# Patient Record
Sex: Female | Born: 2000 | Hispanic: Yes | Marital: Married | Smoking: Former smoker
Health system: Southern US, Community
[De-identification: ages and names within clinical notes are randomized; demographics above are authoritative.]

## PROBLEM LIST (undated history)

## (undated) ENCOUNTER — Emergency Department: Discharge status: Absent without leave | Payer: Self-pay

## (undated) DIAGNOSIS — R7309 Other abnormal glucose: Secondary | ICD-10-CM

## (undated) DIAGNOSIS — E119 Type 2 diabetes mellitus without complications: Secondary | ICD-10-CM

## (undated) HISTORY — PX: NO PAST SURGERIES: SHX2092

## (undated) HISTORY — DX: Other abnormal glucose: R73.09

## (undated) HISTORY — DX: Type 2 diabetes mellitus without complications: E11.9

## (undated) HISTORY — PX: OTHER SURGICAL HISTORY: SHX169

---
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
## 2022-06-23 ENCOUNTER — Ambulatory Visit (LOCAL_COMMUNITY_HEALTH_CENTER): Payer: Medicaid Other | Admitting: Nurse Practitioner

## 2022-06-23 DIAGNOSIS — Z3009 Encounter for other general counseling and advice on contraception: Secondary | ICD-10-CM

## 2022-06-23 DIAGNOSIS — Z3200 Encounter for pregnancy test, result unknown: Secondary | ICD-10-CM

## 2022-06-23 DIAGNOSIS — Z3201 Encounter for pregnancy test, result positive: Secondary | ICD-10-CM

## 2022-06-23 DIAGNOSIS — Z3040 Encounter for surveillance of contraceptives, unspecified: Secondary | ICD-10-CM

## 2022-06-23 LAB — PREGNANCY, URINE: Preg Test, Ur: POSITIVE — AB

## 2022-06-23 MED ORDER — PRENATAL VITAMINS 28-0.8 MG PO TABS: 1.0000 | ORAL_TABLET | Freq: Every day | ORAL | 0 refills | Status: DC

## 2022-06-23 NOTE — Progress Notes (Signed)
Pt here for physical but feels she is pregnant.  Sent to lab for PT.  PT is positive.  Two language line interpreters were utilized for this patient. Initial contact Molly Maduro, [phone] and Lafonda Mosses [phone].  Positive PT packet provided to patient in addition to prenatal vitamins.  Discussed WIC and Social Services to apply for Medicaid for Pregnant Teresa Balint, RN

## 2022-07-28 ENCOUNTER — Telehealth: Payer: Self-pay

## 2022-07-28 NOTE — Telephone Encounter (Signed)
Call to patient to verify some information for upcoming New OB appointment.   Patient is unsure of LMP. Informed patient to give it some though and give [place] an approx LMP for appointment tomorrow. Patient states she thinks she is 13 weeks because she was told in her last visit with [facility] she was 11 weeks.   Per medical history patient has hx of diabetes because her physician in [place] informed her she has high blood sugar. Patient states she was never diagnosed with diabetes, never took diabetic medications, not did she have to check blood sugar. She states it was elevated x1 only.   Informed patient there was [facility] record with her same name and birthday but address in [place]. She states that is not her as she never lived in [place]. Created new [facility] for patient.   Earlyne Iba, RN

## 2022-07-29 ENCOUNTER — Ambulatory Visit: Payer: Medicaid Other | Admitting: Nurse Practitioner

## 2022-07-29 ENCOUNTER — Encounter: Payer: Self-pay | Admitting: Nurse Practitioner

## 2022-07-29 VITALS — BP 96/62 | HR 67 | Temp 98.1°F | Wt 173.6 lb

## 2022-07-29 DIAGNOSIS — Z8719 Personal history of other diseases of the digestive system: Secondary | ICD-10-CM

## 2022-07-29 DIAGNOSIS — Z23 Encounter for immunization: Secondary | ICD-10-CM | POA: Diagnosis not present

## 2022-07-29 DIAGNOSIS — Z3482 Encounter for supervision of other normal pregnancy, second trimester: Secondary | ICD-10-CM

## 2022-07-29 DIAGNOSIS — Z3480 Encounter for supervision of other normal pregnancy, unspecified trimester: Secondary | ICD-10-CM | POA: Insufficient documentation

## 2022-07-29 DIAGNOSIS — Z87898 Personal history of other specified conditions: Secondary | ICD-10-CM | POA: Insufficient documentation

## 2022-07-29 DIAGNOSIS — O0932 Supervision of pregnancy with insufficient antenatal care, second trimester: Secondary | ICD-10-CM

## 2022-07-29 DIAGNOSIS — O219 Vomiting of pregnancy, unspecified: Secondary | ICD-10-CM

## 2022-07-29 DIAGNOSIS — O039 Complete or unspecified spontaneous abortion without complication: Secondary | ICD-10-CM | POA: Insufficient documentation

## 2022-07-29 DIAGNOSIS — O0930 Supervision of pregnancy with insufficient antenatal care, unspecified trimester: Secondary | ICD-10-CM

## 2022-07-29 HISTORY — DX: Personal history of other diseases of the digestive system: Z87.19

## 2022-07-29 LAB — WET PREP FOR TRICH, YEAST, CLUE
Trichomonas Exam: NEGATIVE
Yeast Exam: NEGATIVE

## 2022-07-29 LAB — URINALYSIS
Bilirubin, UA: NEGATIVE
Glucose, UA: NEGATIVE
Ketones, UA: NEGATIVE
Nitrite, UA: NEGATIVE
Protein,UA: NEGATIVE
RBC, UA: NEGATIVE
Specific Gravity, UA: 1.02 (ref 1.005–1.030)
Urobilinogen, Ur: 0.2 mg/dL (ref 0.2–1.0)
pH, UA: 6.5 (ref 5.0–7.5)

## 2022-07-29 LAB — HEMOGLOBIN, FINGERSTICK: Hemoglobin: 12.3 g/dL (ref 11.1–15.9)

## 2022-07-29 MED ORDER — PROMETHAZINE HCL 25 MG PO TABS: 25.0000 mg | ORAL_TABLET | Freq: Three times a day (TID) | ORAL | 0 refills | Status: DC | PRN

## 2022-07-29 NOTE — Progress Notes (Unsigned)
Presents for initiation of prenatal care and denies any medical care thus far in pregnancy. Born in the [place] and to [place] at age 21 years. Returned to [place] at ~ age 77 years. Returned to [place] for visit 2 years ago. Quantiferon TB Gold test today. Desires MaternitT-21 and AFP only testing today. Counseled to set alarm on phone as reminder to take PNV daily and assist her in not forgetting to take (missing ~ 2 days weekly). As client born in the [place], encouraged to apply for pregnancy Medicaid. Per consult with Glenna Fellows FNP-BC, administer 1 hour glucola test today due to past history of elevated blood sugar per client verbal report. Jossie Ng, RN Wet prep reviewed - negative results. Hgb = 12.3 and no intervention required per standing order. Urine dip reviewed by Glenna Fellows FNP-BC. Dip with trace leukocytes, otherwise WNL. Jossie Ng, RN

## 2022-07-29 NOTE — Progress Notes (Unsigned)
[facility]  Maternal Health Clinic   INITIAL PRENATAL VISIT NOTE  Subjective:  Teresa [name] is a 21 y.o. G2P0010 at [redacted]w[redacted]d being seen today to start prenatal care at the [facility].  She is currently monitored for the following issues for this low-risk pregnancy and has Supervision of other normal pregnancy, antepartum; History of non-nicotine vaping; SAB (spontaneous abortion)  12/20/21; and History of constipation on their problem list.  Patient reports headache, nausea, and vomiting.  Contractions: Not present. Vag. Bleeding: None.  Movement: Absent. Denies leaking of fluid.   Indications for ASA therapy (per uptodate) One of the following: Previous pregnancy with preeclampsia, especially early onset and with an adverse outcome No Multifetal gestation No Chronic hypertension No Type 1 or 2 diabetes mellitus No Chronic kidney disease No Autoimmune disease (antiphospholipid syndrome, systemic lupus erythematosus) No  Two or more of the following: Nulliparity No Obesity (body mass index >30 kg/m2) No Family history of preeclampsia in mother or sister No Age ?35 years No Sociodemographic characteristics (African American race, low socioeconomic level) Yes Personal risk factors (eg, previous pregnancy with low birth weight or small for gestational age infant, previous adverse pregnancy outcome [eg, stillbirth], interval >10 years between pregnancies) No   The following portions of the patient's history were reviewed and updated as appropriate: allergies, current medications, past family history, past medical history, past social history, past surgical history and problem list. Problem list updated.  Objective:   Vitals:   07/29/22 1336  BP: 96/62  Pulse: 67  Temp: 98.1 F (36.7 C)  Weight: 173 lb 9.6 oz (78.7 kg)    Fetal Status: Fetal Heart Rate (bpm): 160 Fundal Height: 16 cm Movement: Absent  Presentation:  Undeterminable   Physical Exam Vitals and nursing note reviewed.  Constitutional:      General: She is not in acute distress.    Appearance: Normal appearance. She is well-developed.  HENT:     Head: Normocephalic and atraumatic.     Right Ear: External ear normal.     Left Ear: External ear normal.     Nose: Nose normal. No congestion or rhinorrhea.     Mouth/Throat:     Lips: Pink.     Mouth: Mucous membranes are moist.     Dentition: Normal dentition. No dental caries.     Pharynx: Oropharynx is clear. Uvula midline.     Comments: Dentition: no visible signs of dental caries  Eyes:     General: No scleral icterus.    Conjunctiva/sclera: Conjunctivae normal.     Pupils: Pupils are equal, round, and reactive to light.  Neck:     Thyroid: No thyroid mass or thyromegaly.  Cardiovascular:     Rate and Rhythm: Normal rate.     Pulses: Normal pulses.     Comments: Extremities are warm and well perfused Pulmonary:     Effort: Pulmonary effort is normal.     Breath sounds: Normal breath sounds.  Chest:     Chest wall: No mass.  Breasts:    Tanner Score is 5.     Breasts are symmetrical.     Right: Normal. No mass, nipple discharge or skin change.     Left: Normal. No mass, nipple discharge or skin change.  Abdominal:     General: Abdomen is flat.     Palpations: Abdomen is soft.     Tenderness: There is no abdominal tenderness.     Comments: Gravid  Genitourinary:    General: Normal vulva.     Exam position: Lithotomy position.     Pubic Area: No rash.      Labia:        Right: No rash.        Left: No rash.      Vagina: Normal. No vaginal discharge.     Cervix: No cervical motion tenderness or friability.     Uterus: Normal. Enlarged (Gravid 15 size). Not tender.      Adnexa: Right adnexa normal and left adnexa normal.     Rectum: Normal. No external hemorrhoid.     Comments: Amount Discharge: small  Odor: No pH: less than 4.5 Adheres to vaginal wall: No Color:  Thomos Domine  Musculoskeletal:     Cervical back: Full passive range of motion without pain, normal range of motion and neck supple.     Right lower leg: No edema.     Left lower leg: No edema.  Lymphadenopathy:     Cervical: No cervical adenopathy.     Upper Body:     Right upper body: No axillary adenopathy.     Left upper body: No axillary adenopathy.  Skin:    General: Skin is warm and dry.     Capillary Refill: Capillary refill takes less than 2 seconds.  Neurological:     Mental Status: She is alert and oriented to person, place, and time.  Psychiatric:        Attention and Perception: Attention normal.        Mood and Affect: Mood normal.        Speech: Speech normal.        Behavior: Behavior normal. Behavior is cooperative.     Assessment and Plan:  Pregnancy: G2P0010 at [redacted]w[redacted]d  1. History of constipation -History of constipation, patient given resource sheet on how to manage or prevent constipation.  Will continue to monitor.   2. SAB (spontaneous abortion)  12/20/21 -SAB in 12/2021.   3. History of non-nicotine vaping -Denies current use.   4. Supervision of other normal pregnancy, antepartum -21 year old female in clinic today for prenatal care. -Patient states she is taking PNV daily. -Patient reports some nausea and vomiting.  Patient encouraged to eat meals high in protein every 2 hours for nausea and also given resource sheet. Will continue to monitor.   -Patient also reports left sided lower abdominal pain.  Pain may be attributed to round ligament pain or musculoskeletal pain due to vomiting.  Resource sheet given to patient on round ligament pain.  Urine culture obtained to assess for other infections.  -Patient excited about the pregnancy. -Lives with husband.  Patient works at [employer]. -Patient states that she is unsure of last LMP, dating U/S submitted.  -Denies alcohol and drug use.  Last alcohol use was around 5 months ago.  UDS performed today.  -Patient  reports never being to a dentist.   Patient given dental information.   -Declines genetic counseling.  Agrees to [employer] -Patient states she has had 1 COVID vaccine.  Patient agrees to Flu vaccine today.  -Patient is currently 15 weeks, ASA not recommended due to late entry to care.  -PHQ-9= 9, denies thoughts of self harm or signs and symptoms of depression.  Patient states score is a 9 due to being extremely sick and tired. -Hgb= 12.3 -Patient reports a history of elevated blood glucose in the past.  BMI 29, will obtain a 1 hour glucose to  rule out diabetes.  Will await test results.       - Prenatal Profile I - QuantiFERON-TB Gold Plus - Glucose, 1 hour - Hgb Fractionation Cascade - AFP, Serum, Open Spina Bifida - Varicella zoster antibody, IgG - MaterniT 21 plus Core, Blood - WET PREP FOR TRICH, YEAST, CLUE - [id number] Drug Screen - Hemoglobin, venipuncture - Urinalysis (Urine Dip)  5. Late prenatal care -Prenatal care initiated at 15 weeks.      6. Vomiting affecting pregnancy -Frequent nausea and vomiting reported by patient.  Patient encouraged to eat meals high in protein every 2 hours for nausea and also given resource sheet. Will continue to monitor.   Patient also given Phenergan if needed, advised to take 0.5 - 1 tab PRN.   - promethazine (PHENERGAN) 25 MG tablet; Take 1 tablet (25 mg total) by mouth every 8 (eight) hours as needed for nausea or vomiting (take 0.5 - 1 tablet q8hrs PRN).  Dispense: 15 tablet; Refill: 0     Discussed overview of care and coordination with inpatient delivery practices including [facility], [facility], [facility] and [facility].    Term labor symptoms and general obstetric precautions including but not limited to vaginal bleeding, contractions, leaking of fluid and fetal movement were reviewed in detail with the patient.  Please refer to After Visit Summary for other counseling recommendations.   Due to a language barrier an  interpreter Roddie Mc Yemen) was used for the provider portion of the visit.     Return in 4 weeks (on 08/26/2022) for Routine prenatal care visit.    Glenna Fellows, FNP

## 2022-07-30 ENCOUNTER — Telehealth: Payer: Self-pay

## 2022-07-30 DIAGNOSIS — O0930 Supervision of pregnancy with insufficient antenatal care, unspecified trimester: Secondary | ICD-10-CM | POA: Insufficient documentation

## 2022-07-30 DIAGNOSIS — O219 Vomiting of pregnancy, unspecified: Secondary | ICD-10-CM | POA: Insufficient documentation

## 2022-07-30 LAB — 789231 7+OXYCODONE-BUND
Amphetamines, Urine: NEGATIVE ng/mL
BENZODIAZ UR QL: NEGATIVE ng/mL
Barbiturate screen, urine: NEGATIVE ng/mL
Cannabinoid Quant, Ur: NEGATIVE ng/mL
Cocaine (Metab.): NEGATIVE ng/mL
OPIATE SCREEN URINE: NEGATIVE ng/mL
Oxycodone/Oxymorphone, Urine: NEGATIVE ng/mL
PCP Quant, Ur: NEGATIVE ng/mL

## 2022-07-30 NOTE — Telephone Encounter (Signed)
Call to client with [facility] [employer] ID # [phone] to notify her of 08/05/2022 1600 [place] appt at [facility] (arrive 1545 with full bladder). Per recorded message, voicemail box is not set up. As appt 08/05/2022 and [facility] closed 12/25 - 08/05/22, call made to spouse (emergency contact). Spouse states client is working and unable to answer her phone. Spouse provided [place] appt information and facility address. He states will give his wife the [place] appt information. Jossie Ng, RN

## 2022-08-05 ENCOUNTER — Other Ambulatory Visit: Payer: Self-pay | Admitting: Nurse Practitioner

## 2022-08-05 ENCOUNTER — Ambulatory Visit
Admission: RE | Admit: 2022-08-05 | Discharge: 2022-08-05 | Disposition: A | Discharge status: Home / Self Care | Payer: Medicaid Other | Source: Ambulatory Visit | Attending: Nurse Practitioner | Admitting: Nurse Practitioner

## 2022-08-05 DIAGNOSIS — O219 Vomiting of pregnancy, unspecified: Secondary | ICD-10-CM

## 2022-08-05 DIAGNOSIS — Z3A12 12 weeks gestation of pregnancy: Secondary | ICD-10-CM | POA: Insufficient documentation

## 2022-08-05 DIAGNOSIS — O039 Complete or unspecified spontaneous abortion without complication: Secondary | ICD-10-CM

## 2022-08-05 DIAGNOSIS — Z3481 Encounter for supervision of other normal pregnancy, first trimester: Secondary | ICD-10-CM | POA: Diagnosis not present

## 2022-08-05 DIAGNOSIS — Z3480 Encounter for supervision of other normal pregnancy, unspecified trimester: Secondary | ICD-10-CM | POA: Diagnosis present

## 2022-08-05 DIAGNOSIS — O0930 Supervision of pregnancy with insufficient antenatal care, unspecified trimester: Secondary | ICD-10-CM

## 2022-08-05 DIAGNOSIS — Z8719 Personal history of other diseases of the digestive system: Secondary | ICD-10-CM

## 2022-08-05 DIAGNOSIS — Z87898 Personal history of other specified conditions: Secondary | ICD-10-CM

## 2022-08-10 NOTE — L&D Delivery Note (Addendum)
     Delivery Note   Teresa [name] is a 22 y.o. G2P0010 at [redacted]w[redacted]d Estimated Date of Delivery: 02/13/23  PRE-OPERATIVE DIAGNOSIS:  1) [redacted]w[redacted]d pregnancy.    POST-OPERATIVE DIAGNOSIS:  1) [redacted]w[redacted]d pregnancy s/p Vaginal, Spontaneous    Delivery Type: Vaginal, Spontaneous    Delivery Anesthesia: Local   Labor Complications:  none    ESTIMATED BLOOD LOSS: 460 ml    FINDINGS:   1) female infant, Apgar scores of  8  at 1 minute and   9 at 5 minutes and a birthweight pending, infant skin to skin .    2) Nuchal cord: no  SPECIMENS:   PLACENTA:   Appearance: Intact , 3 vessel cord    Removal: Spontaneous      Disposition:  per protocol   DISPOSITION:  Infant to left in stable condition in the delivery room, with L&D personnel and mother,  NARRATIVE SUMMARY: Labor course:  Ms. Teresa [name] is a G2P0010 at [redacted]w[redacted]d who presented for labor management.  She progressed well in labor without pitocin.  She received the appropriate anesthesia and proceeded to complete dilation. She evidenced good maternal expulsive effort during the second stage. She went on to deliver a viable female infant. The placenta delivered without problems and was noted to be complete. A perineal and vaginal examination was performed. Lacerations: 2nd degree , perineal   Lacerations was repaired with 3-0 Vicryl Rapide suture using local anesthesia. The patient tolerated this well.  Doreene Burke, CNM  02/15/2023 8:19 AM

## 2022-08-11 ENCOUNTER — Telehealth: Payer: Self-pay

## 2022-08-11 LAB — PREGNANCY, INITIAL SCREEN
Antibody Screen: NEGATIVE
Basophils Absolute: 0.1 10*3/uL (ref 0.0–0.2)
Basos: 1 %
Bilirubin, UA: NEGATIVE
Chlamydia trachomatis, NAA: NEGATIVE
EOS (ABSOLUTE): 0.2 10*3/uL (ref 0.0–0.4)
Eos: 2 %
Glucose, UA: NEGATIVE
HCV Ab: NONREACTIVE
HIV Screen 4th Generation wRfx: NONREACTIVE
Hematocrit: 37.9 % (ref 34.0–46.6)
Hemoglobin: 12.5 g/dL (ref 11.1–15.9)
Hepatitis B Surface Ag: NEGATIVE
Immature Grans (Abs): 0 10*3/uL (ref 0.0–0.1)
Immature Granulocytes: 0 %
Ketones, UA: NEGATIVE
Lymphocytes Absolute: 2.2 10*3/uL (ref 0.7–3.1)
Lymphs: 23 %
MCH: 28.9 pg (ref 26.6–33.0)
MCHC: 33 g/dL (ref 31.5–35.7)
MCV: 88 fL (ref 79–97)
Monocytes Absolute: 0.6 10*3/uL (ref 0.1–0.9)
Monocytes: 6 %
Neisseria Gonorrhoeae by PCR: NEGATIVE
Neutrophils Absolute: 6.6 10*3/uL (ref 1.4–7.0)
Neutrophils: 68 %
Nitrite, UA: NEGATIVE
Platelets: 269 10*3/uL (ref 150–450)
Protein,UA: NEGATIVE
RBC, UA: NEGATIVE
RBC: 4.33 x10E6/uL (ref 3.77–5.28)
RDW: 13.1 % (ref 11.7–15.4)
RPR Ser Ql: NONREACTIVE
Rh Factor: POSITIVE
Rubella Antibodies, IGG: 1.04 index (ref >0.99)
Specific Gravity, UA: 1.016 (ref 1.005–1.030)
Urobilinogen, Ur: 0.2 mg/dL (ref 0.2–1.0)
WBC: 9.6 10*3/uL (ref 3.4–10.8)
pH, UA: 6.5 (ref 5.0–7.5)

## 2022-08-11 LAB — GLUCOSE, 1 HOUR GESTATIONAL: Gestational Diabetes Screen: 77 mg/dL (ref 70–139)

## 2022-08-11 LAB — AFP, SERUM, OPEN SPINA BIFIDA
AFP MoM: 0.38
AFP Value: 11 ng/mL
Gest. Age on Collection Date: 15.6 weeks
Maternal Age At EDD: 20 yr
OSBR Risk 1 IN: 10000
Test Results:: NEGATIVE
Weight: 173 [lb_av]

## 2022-08-11 LAB — QUANTIFERON-TB GOLD PLUS
QuantiFERON Mitogen Value: >10 IU/mL
QuantiFERON Nil Value: 0.09 IU/mL
QuantiFERON TB1 Ag Value: 0.12 IU/mL
QuantiFERON TB2 Ag Value: 0.14 IU/mL
QuantiFERON-TB Gold Plus: NEGATIVE

## 2022-08-11 LAB — MATERNIT 21 PLUS CORE, BLOOD
Fetal Fraction: 11
Result (T21): NEGATIVE
Trisomy 13 (Patau syndrome): NEGATIVE
Trisomy 18 (Edwards syndrome): NEGATIVE
Trisomy 21 (Down syndrome): NEGATIVE

## 2022-08-11 LAB — HGB FRACTIONATION CASCADE
Hgb A2: 2.7 % (ref 1.8–3.2)
Hgb A: 97.3 % (ref 96.4–98.8)
Hgb F: 0 % (ref 0.0–2.0)
Hgb S: 0 %

## 2022-08-11 LAB — MICROSCOPIC EXAMINATION
Casts: NONE SEEN /lpf
RBC, Urine: NONE SEEN /hpf (ref 0–2)
WBC, UA: NONE SEEN /hpf (ref 0–5)

## 2022-08-11 LAB — HCV INTERPRETATION

## 2022-08-11 LAB — VARICELLA ZOSTER ANTIBODY, IGG: Varicella zoster IgG: 586 index (ref >165)

## 2022-08-11 LAB — URINE CULTURE, OB REFLEX

## 2022-08-11 NOTE — Telephone Encounter (Signed)
TC to [facility] to add patient's weight to AFP only test. .Jenetta Downer, RN

## 2022-08-21 NOTE — Addendum Note (Signed)
Addended by: Cletis Media on: 08/21/2022 04:07 PM   Modules accepted: Orders

## 2022-08-26 ENCOUNTER — Ambulatory Visit: Payer: Medicaid Other | Admitting: Advanced Practice Midwife

## 2022-08-26 VITALS — BP 93/66 | HR 61 | Temp 97.0°F | Wt 172.2 lb

## 2022-08-26 DIAGNOSIS — O0930 Supervision of pregnancy with insufficient antenatal care, unspecified trimester: Secondary | ICD-10-CM

## 2022-08-26 DIAGNOSIS — O0932 Supervision of pregnancy with insufficient antenatal care, second trimester: Secondary | ICD-10-CM

## 2022-08-26 DIAGNOSIS — Z87898 Personal history of other specified conditions: Secondary | ICD-10-CM

## 2022-08-26 DIAGNOSIS — Z3480 Encounter for supervision of other normal pregnancy, unspecified trimester: Secondary | ICD-10-CM

## 2022-08-26 DIAGNOSIS — Z3482 Encounter for supervision of other normal pregnancy, second trimester: Secondary | ICD-10-CM

## 2022-08-26 NOTE — Progress Notes (Signed)
[place] [facility] Maternal Health Clinic  PRENATAL VISIT NOTE  Subjective:  Teresa [name] is a 22 y.o. G2P0010 at [redacted]w[redacted]d being seen today for ongoing prenatal care.  She is currently monitored for the following issues for this low-risk pregnancy and has Supervision of other normal pregnancy, antepartum; History of non-nicotine vaping; SAB (spontaneous abortion)  12/20/21; History of constipation; Late prenatal care; and Vomiting affecting pregnancy on their problem list.  Patient reports headache.  Contractions: Not present. Vag. Bleeding: None.  Movement: Present. Denies leaking of fluid/ROM.   The following portions of the patient's history were reviewed and updated as appropriate: allergies, current medications, past family history, past medical history, past social history, past surgical history and problem list. Problem list updated.  Objective:   Vitals:   08/26/22 1509  BP: 93/66  Pulse: 61  Temp: (!) 97 F (36.1 C)  Weight: 172 lb 3.2 oz (78.1 kg)    Fetal Status: Fetal Heart Rate (bpm): 150 Fundal Height: 19 cm Movement: Present     General:  Alert, oriented and cooperative. Patient is in no acute distress.  Skin: Skin is warm and dry. No rash noted.   Cardiovascular: Normal heart rate noted  Respiratory: Normal respiratory effort, no problems with respiration noted  Abdomen: Soft, gravid, appropriate for gestational age.  Pain/Pressure: Absent     Pelvic: Cervical exam deferred        Extremities: Normal range of motion.  Edema: None  Mental Status: Normal mood and affect. Normal behavior. Normal judgment and thought content.   Assessment and Plan:  Pregnancy: G2P0010 at [redacted]w[redacted]d  1. Supervision of other normal pregnancy, antepartum Here with FOB C/o h/a past 3 nights always bilateral temples x 30 min relieved with cold cloth and sleep; suggestions given Last vomited 5 days ago. 1 hour glucola=77 on 07/29/22 NIPS neg 07/29/22 AFP only neg  07/29/22 Reviewed 08/05/22 u/s at 12  4/7 with EDC=02/13/23 Not working Living with husband Doesn't know how to set up voicemail--RN assisted pt Anatomy u/s ordered  - US OB Comp + 14 Wk; Future  2. Late prenatal care    Preterm labor symptoms and general obstetric precautions including but not limited to vaginal bleeding, contractions, leaking of fluid and fetal movement were reviewed in detail with the patient. Please refer to After Visit Summary for other counseling recommendations.  Return in about 4 weeks (around 09/23/2022) for routine PNC.  Future Appointments  Date Time Provider [place]  09/23/2022 10:00 AM [facility] PROVIDER [facility] None    Herbie Saxon, CNM

## 2022-08-26 NOTE — Progress Notes (Signed)
Gilbert France utilized.  Requested patient set up voice mail. Undecided Pediatrician (has list) and undecided birth control method after pregnancy. BThiele RN

## 2022-08-27 ENCOUNTER — Telehealth: Payer: Self-pay

## 2022-08-27 NOTE — Telephone Encounter (Signed)
TC to patient with [facility], [place], [place] [address], to inform client of [facility] U/S on 09/18/22 at 1:00. Patient counseled to arrive 12:00 at the [facility] with full bladder. Patient can take 1 adult, no children to appointment. Patient states understanding. Reminded of next [place] [facility] as well.Jenetta Downer, RN

## 2022-09-10 IMAGING — US US OB < 14 WEEKS - US OB TV
1 series · 14 of 28 positions shown · non-contrast
Comparison: None Available.

CLINICAL DATA: Vaginal bleeding for 1 day, beta HCG 104

EXAM:
OBSTETRIC <14 WK US AND TRANSVAGINAL OB US
TECHNIQUE: Both transabdominal and transvaginal ultrasound examinations were
performed for complete evaluation of the gestation as well as the
maternal uterus, adnexal regions, and pelvic cul-de-sac.
Transvaginal technique was performed to assess early pregnancy.

[Series 1: us ob less than 14 weeks with ob transvaginal · 14 of 78 slices shown]
[im 3/78]
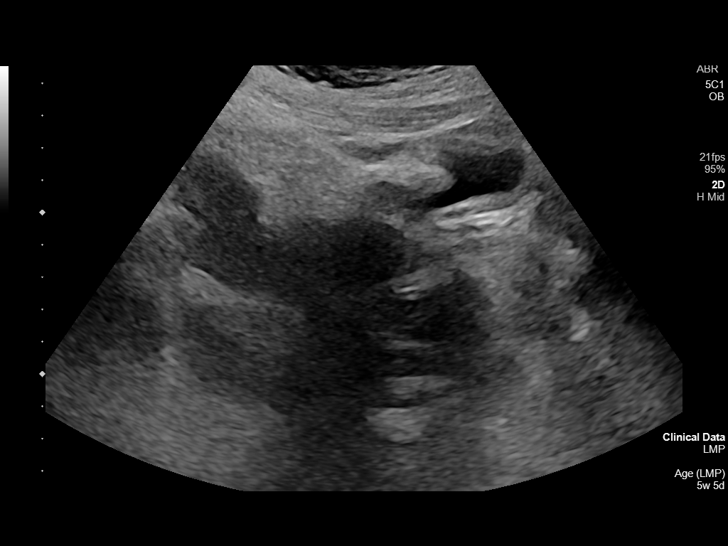
[im 9/78]
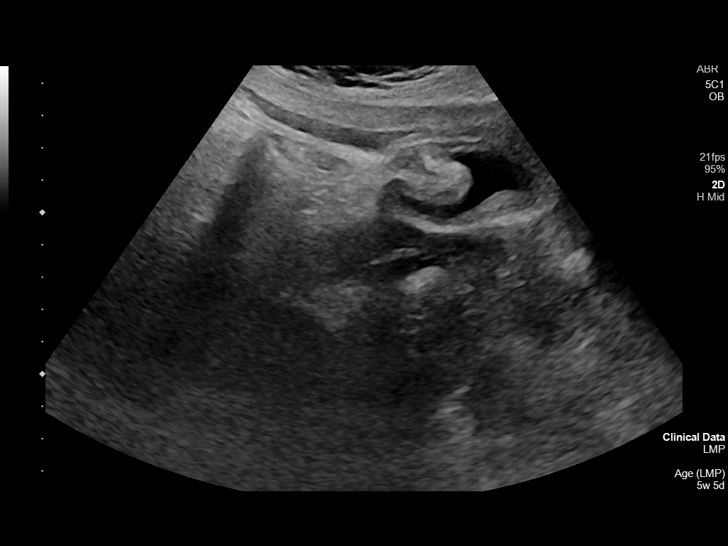
[im 15/78]
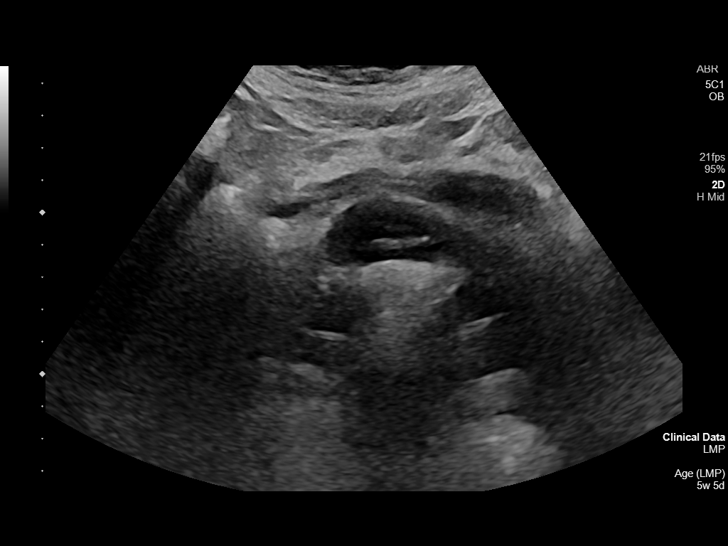
[im 20/78]
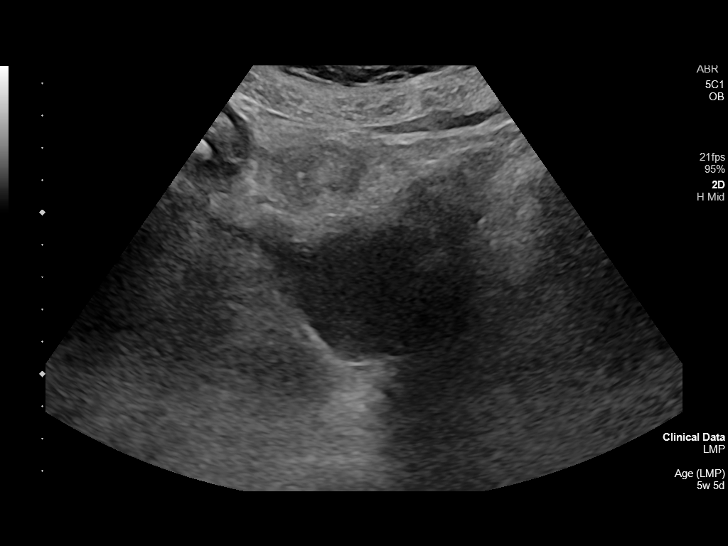
[im 26/78]
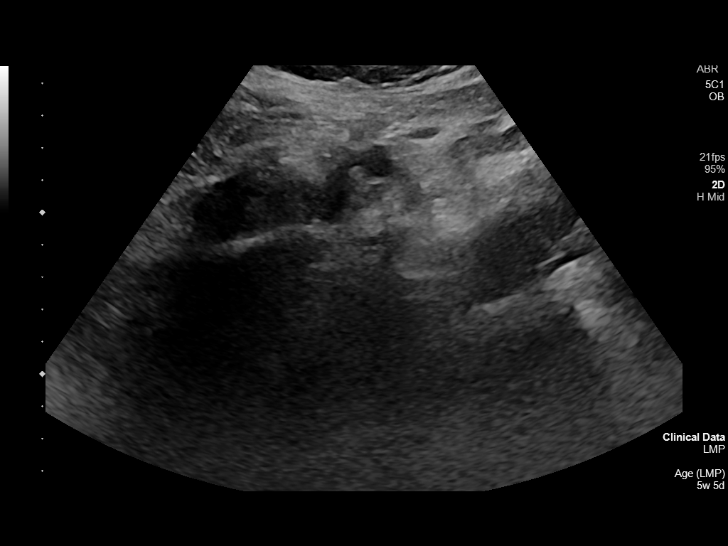
[im 32/78]
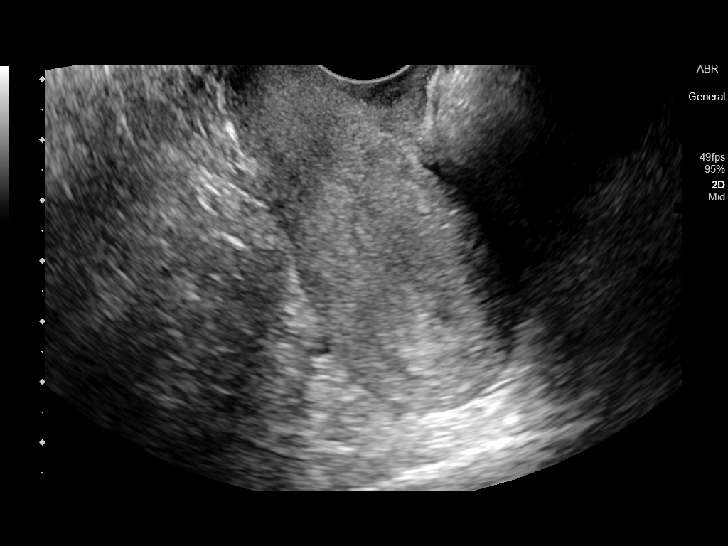
[im 38/78]
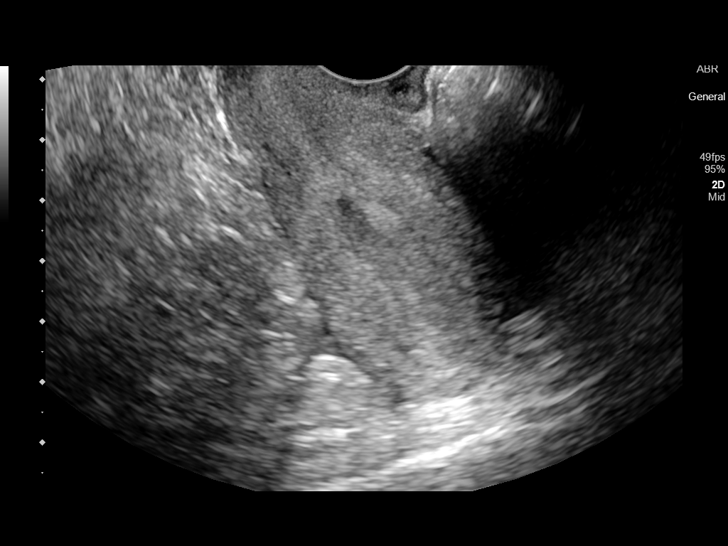
[im 43/78]
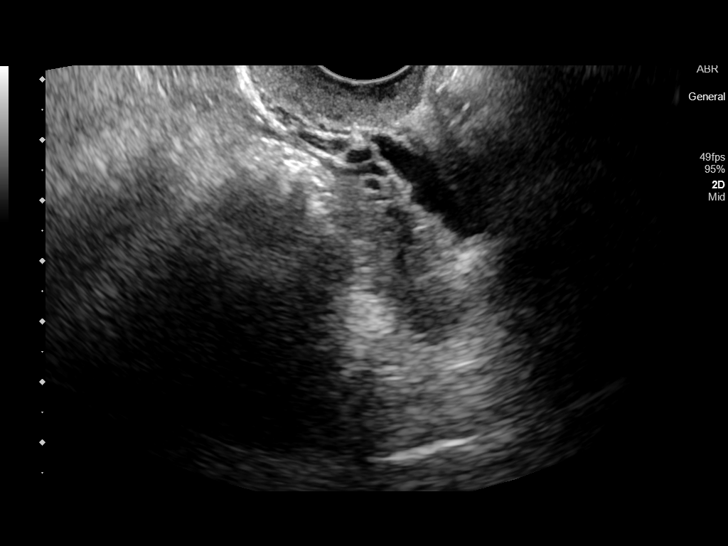
[im 49/78]
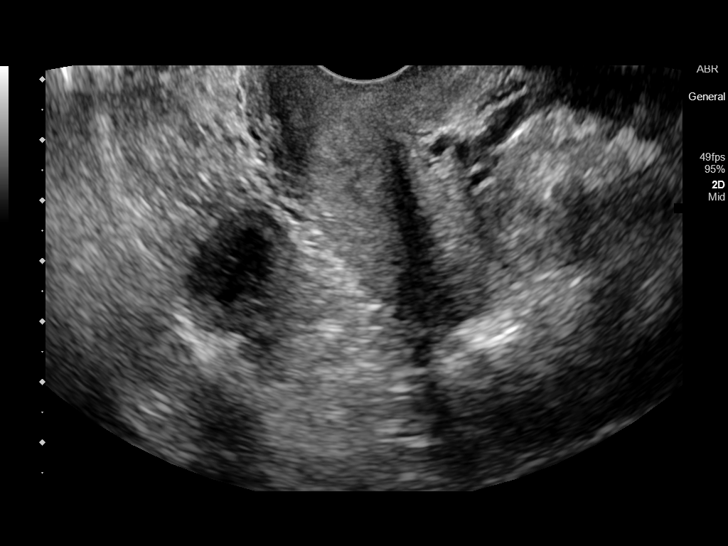
[im 55/78]
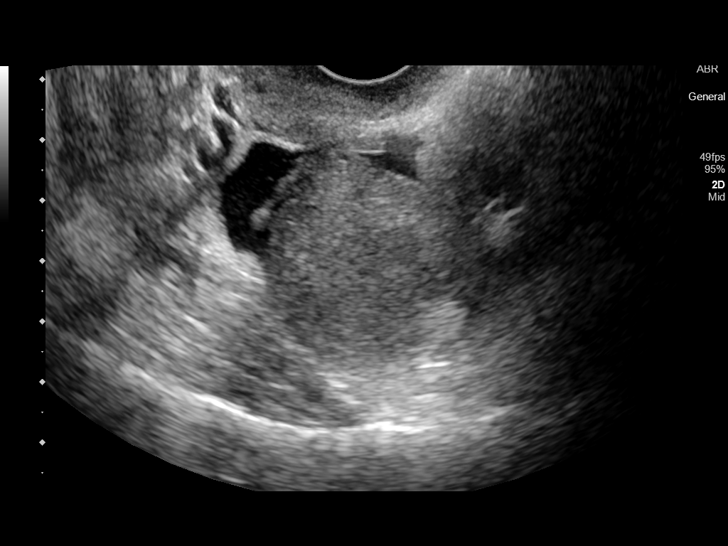
[im 60/78]
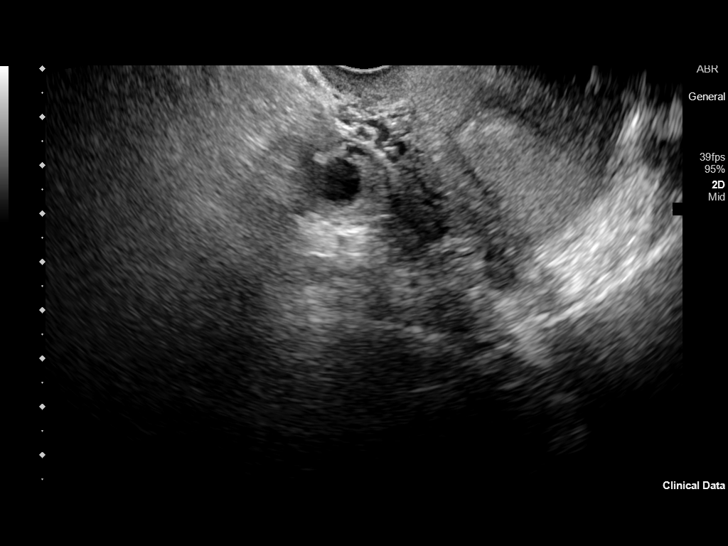
[im 66/78]
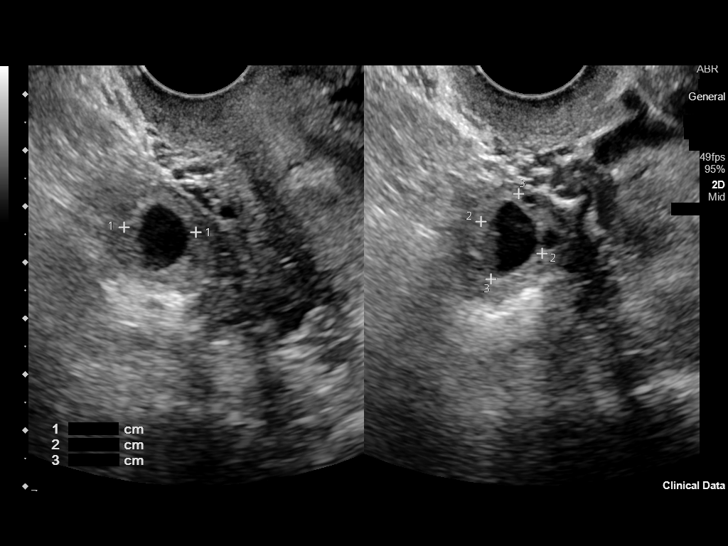
[im 72/78]
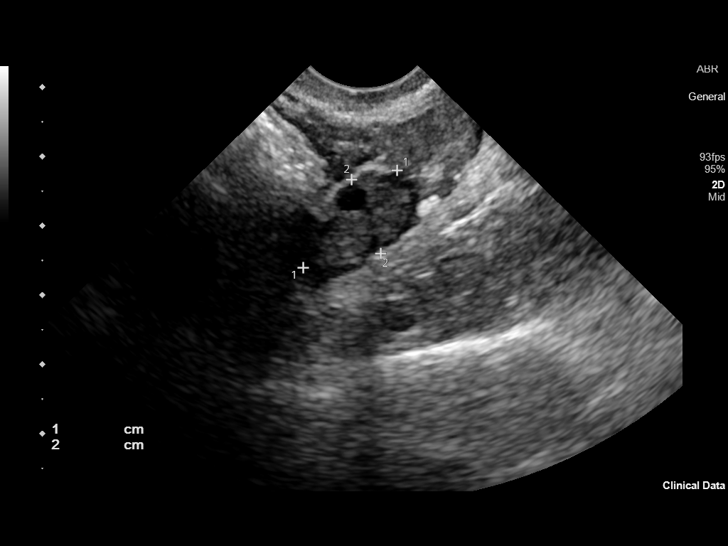
[im 78/78]
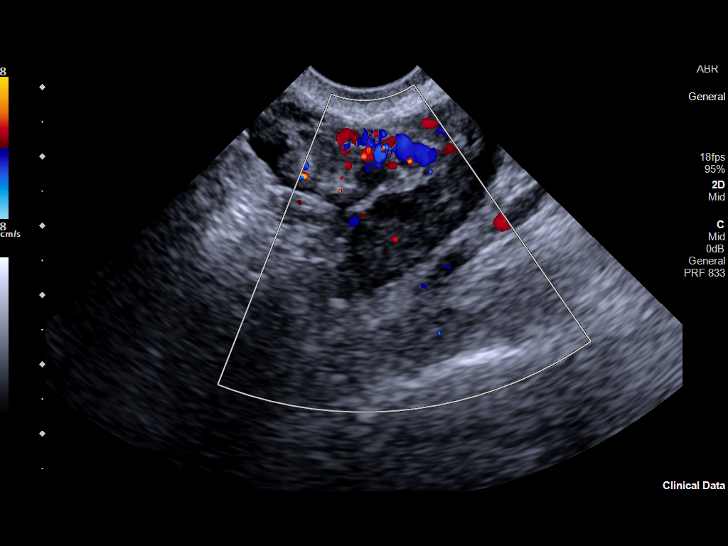

[14 of 28 positions shown; findings below may reference images not displayed]

FINDINGS: Intrauterine gestational sac: None

Yolk sac:  Not Visualized.

Embryo:  Not Visualized.

Cardiac Activity: Not Visualized.

Maternal uterus/adnexae: Uterus is retroverted. Endometrium measures
21 mm in thickness.

Right ovary measures 2.7 x 1.9 x 2.0 cm, with likely corpus luteum
cyst measuring 1.3 x 1.2 x 1.6 cm. Left ovary measures 2.0 x 1.2 x
1.5 cm. Trace pelvic free fluid.
IMPRESSION: 1. No evidence of intrauterine pregnancy at this time, not
unexpected given low beta HCG levels. Serial beta HCG measurements
and follow-up ultrasound recommended.
2. Probable corpus luteum cyst right ovary.
3. Trace pelvic free fluid, likely physiologic.

## 2022-09-18 ENCOUNTER — Ambulatory Visit
Admission: RE | Admit: 2022-09-18 | Discharge: 2022-09-18 | Disposition: A | Discharge status: Home / Self Care | Payer: Medicaid Other | Source: Ambulatory Visit | Attending: Advanced Practice Midwife | Admitting: Advanced Practice Midwife

## 2022-09-18 DIAGNOSIS — Z3480 Encounter for supervision of other normal pregnancy, unspecified trimester: Secondary | ICD-10-CM | POA: Diagnosis present

## 2022-09-18 DIAGNOSIS — Z3689 Encounter for other specified antenatal screening: Secondary | ICD-10-CM | POA: Insufficient documentation

## 2022-09-18 DIAGNOSIS — Z3A19 19 weeks gestation of pregnancy: Secondary | ICD-10-CM | POA: Diagnosis not present

## 2022-09-22 ENCOUNTER — Encounter: Payer: Self-pay | Admitting: Advanced Practice Midwife

## 2022-09-23 ENCOUNTER — Ambulatory Visit: Payer: Medicaid Other | Admitting: Advanced Practice Midwife

## 2022-09-23 ENCOUNTER — Telehealth: Payer: Self-pay

## 2022-09-23 VITALS — BP 103/59 | HR 77 | Temp 98.3°F | Wt 175.8 lb

## 2022-09-23 DIAGNOSIS — Z87898 Personal history of other specified conditions: Secondary | ICD-10-CM

## 2022-09-23 DIAGNOSIS — Z3480 Encounter for supervision of other normal pregnancy, unspecified trimester: Secondary | ICD-10-CM

## 2022-09-23 DIAGNOSIS — Z3482 Encounter for supervision of other normal pregnancy, second trimester: Secondary | ICD-10-CM

## 2022-09-23 DIAGNOSIS — O0930 Supervision of pregnancy with insufficient antenatal care, unspecified trimester: Secondary | ICD-10-CM

## 2022-09-23 DIAGNOSIS — O0932 Supervision of pregnancy with insufficient antenatal care, second trimester: Secondary | ICD-10-CM

## 2022-09-23 NOTE — Progress Notes (Signed)
[place] [facility] M[facility] Clinic  PRENATAL VISIT NOTE  Subjective:  Jacqulene Claridge is a 22 y.o. G2P0010 at 65w4dbeing seen today for ongoing prenatal care.  She is currently monitored for the following issues for this low-risk pregnancy and has Supervision of other normal pregnancy, antepartum; History of non-nicotine vaping; Late prenatal care 15 4/7; and Vomiting affecting pregnancy on their problem list.  Patient reports  increased vaginal d/c x 3 wks  .  Contractions: Not present. Vag. Bleeding: None.  Movement: Present. Denies leaking of fluid/ROM.   The following portions of the patient's history were reviewed and updated as appropriate: allergies, current medications, past family history, past medical history, past social history, past surgical history and problem list. Problem list updated.  Objective:   Vitals:   09/23/22 1004  BP: (!) 103/59  Pulse: 77  Temp: 98.3 F (36.8 C)  Weight: 175 lb 12.8 oz (79.7 kg)    Fetal Status: Fetal Heart Rate (bpm): 150 Fundal Height: 19 cm Movement: Present     General:  Alert, oriented and cooperative. Patient is in no acute distress.  Skin: Skin is warm and dry. No rash noted.   Cardiovascular: Normal heart rate noted  Respiratory: Normal respiratory effort, no problems with respiration noted  Abdomen: Soft, gravid, appropriate for gestational age.  Pain/Pressure: Absent     Pelvic: Cervical exam deferred        Extremities: Normal range of motion.  Edema: None  Mental Status: Normal mood and affect. Normal behavior. Normal judgment and thought content.   Assessment and Plan:  Pregnancy: G2P0010 at 180w4d1. Supervision of other normal pregnancy, antepartum Reviewed 09/18/22 u/s at 19.0 wks with anterior placenta, no previa, AFI wnl, 3VC, incomplete visualization of upper lip and ventricular outflow tract views, EDC=01/17/23 Large discrepancy in 2 u/s EDC on this pt (02/13/23 and 01/17/23). Choosing earlier  u/s EDC of 02/13/23 Pt desires f/u u/s for incomplete views--ordered -4 lb 3.2 oz (-1.905 kg) Not working Walking 5x/wk x 10 min C/o increased vaginal white d/c x3 wks without any other sxs; pt reassured  - [place] Follow Up; Future  2. Late prenatal care 15 4/7   3. History of non-nicotine vaping denies   Preterm labor symptoms and general obstetric precautions including but not limited to vaginal bleeding, contractions, leaking of fluid and fetal movement were reviewed in detail with the patient. Please refer to After Visit Summary for other counseling recommendations.  Return in about 4 weeks (around 10/21/2022) for routine PNC.  No future appointments.  ElHerbie SaxonCNM

## 2022-09-23 NOTE — Telephone Encounter (Signed)
Call to patient to let her know of scheduled [place] f/u for 10/15/22 for 12:45 arrival time with a full bladder. Patient verbalized understanding.   Patient left clinic without scheduling 4 wk [place] [facility] appointment. [place] [facility] appt scheduled today while on the phone with her.   Al Decant, RN

## 2022-10-15 ENCOUNTER — Ambulatory Visit: Payer: Self-pay

## 2022-10-21 ENCOUNTER — Ambulatory Visit: Payer: Medicaid Other | Admitting: Advanced Practice Midwife

## 2022-10-21 ENCOUNTER — Telehealth: Payer: Self-pay

## 2022-10-21 VITALS — BP 93/56 | HR 74 | Temp 97.5°F | Wt 180.0 lb

## 2022-10-21 DIAGNOSIS — Z3480 Encounter for supervision of other normal pregnancy, unspecified trimester: Secondary | ICD-10-CM

## 2022-10-21 DIAGNOSIS — O0930 Supervision of pregnancy with insufficient antenatal care, unspecified trimester: Secondary | ICD-10-CM

## 2022-10-21 DIAGNOSIS — O0932 Supervision of pregnancy with insufficient antenatal care, second trimester: Secondary | ICD-10-CM

## 2022-10-21 DIAGNOSIS — Z3482 Encounter for supervision of other normal pregnancy, second trimester: Secondary | ICD-10-CM

## 2022-10-21 NOTE — Addendum Note (Signed)
Addended by: Donnal Moat on: 10/21/2022 03:19 PM   Modules accepted: Orders

## 2022-10-21 NOTE — Progress Notes (Signed)
BCM: Nexplanon; Peds:  undecided.Pediatrician List given.  [facility] Spanish Interpretor utilized Jamas Lav). BTHIELE RN

## 2022-10-21 NOTE — Progress Notes (Signed)
[place] [facility]  PRENATAL VISIT NOTE  Subjective:  Teresa [name] is a 22 y.o. G2P0010 at 19w4dbeing seen today for ongoing prenatal care.  She is currently monitored for the following issues for this low-risk pregnancy and has Supervision of other normal pregnancy, antepartum; History of non-nicotine vaping; Late prenatal care 15 4/7; and Vomiting affecting pregnancy on their problem list.  Patient reports  RLQ discomfort when lying on left side occassionally relieved with changing position .  Contractions: Not present. Vag. Bleeding: None.  Movement: Present. Denies leaking of fluid/ROM.   The following portions of the patient's history were reviewed and updated as appropriate: allergies, current medications, past family history, past medical history, past social history, past surgical history and problem list. Problem list updated.  Objective:   Vitals:   10/21/22 1432  BP: (!) 93/56  Pulse: 74  Temp: (!) 97.5 F (36.4 C)  Weight: 180 lb (81.6 kg)    Fetal Status: Fetal Heart Rate (bpm): 136 Fundal Height: 27 cm Movement: Present     General:  Alert, oriented and cooperative. Patient is in no acute distress.  Skin: Skin is warm and dry. No rash noted.   Cardiovascular: Normal heart rate noted  Respiratory: Normal respiratory effort, no problems with respiration noted  Abdomen: Soft, gravid, appropriate for gestational age.  Pain/Pressure: Absent     Pelvic: Cervical exam deferred        Extremities: Normal range of motion.  Edema: None  Mental Status: Normal mood and affect. Normal behavior. Normal judgment and thought content.   Assessment and Plan:  Pregnancy: G2P0010 at 233w4d1. Supervision of other normal pregnancy, antepartum Not working Walking 3x/wk x 3 min; counseled to walk or swim x 20 min 0 lb (0 kg) 5 lb wt [facility] which [facility] requested I schedule for incomplete anatomy on 09/18/22. Pt  states she didn't have $ to pay for it but wants me to reschedule u/s for April; rescheduled due to incomplete visualization of upper lip and ventricular outflow tract.   2. Late prenatal care 15 4/7    Preterm labor symptoms and general obstetric precautions including but not limited to vaginal bleeding, contractions, leaking of fluid and fetal movement were reviewed in detail with the patient. Please refer to After Visit Summary for other counseling recommendations.  Return in about 4 weeks (around 11/18/2022) for routine PNC, 28 week labs.  Future Appointments  Date Time Provider [place]  3:15 PM AC-MH PROVIDER AC-MAT None    ElHerbie SaxonCNM

## 2022-10-21 NOTE — Telephone Encounter (Signed)
[facility] Spanish Interpretor utilized Jamas Lav n). Notified patient of date and time of U/S (11/16/2022 and to arrive at 3:45 pm. Instructions given and patient verbalized understanding. BThiele RN

## 2022-11-16 ENCOUNTER — Ambulatory Visit
Admission: RE | Admit: 2022-11-16 | Discharge: 2022-11-16 | Disposition: A | Discharge status: Home / Self Care | Payer: Medicaid Other | Source: Ambulatory Visit | Attending: Advanced Practice Midwife | Admitting: Advanced Practice Midwife

## 2022-11-16 DIAGNOSIS — Z3480 Encounter for supervision of other normal pregnancy, unspecified trimester: Secondary | ICD-10-CM | POA: Diagnosis not present

## 2022-11-16 DIAGNOSIS — O0001 Abdominal pregnancy with intrauterine pregnancy: Secondary | ICD-10-CM | POA: Diagnosis not present

## 2022-11-16 DIAGNOSIS — Z3A27 27 weeks gestation of pregnancy: Secondary | ICD-10-CM | POA: Diagnosis not present

## 2022-11-16 DIAGNOSIS — Z363 Encounter for antenatal screening for malformations: Secondary | ICD-10-CM | POA: Diagnosis not present

## 2022-11-18 ENCOUNTER — Ambulatory Visit: Payer: Medicaid Other | Admitting: Advanced Practice Midwife

## 2022-11-18 VITALS — BP 107/67 | HR 84 | Temp 98.3°F | Wt 190.8 lb

## 2022-11-18 DIAGNOSIS — Z23 Encounter for immunization: Secondary | ICD-10-CM

## 2022-11-18 DIAGNOSIS — Z3480 Encounter for supervision of other normal pregnancy, unspecified trimester: Secondary | ICD-10-CM

## 2022-11-18 DIAGNOSIS — Z3483 Encounter for supervision of other normal pregnancy, third trimester: Secondary | ICD-10-CM

## 2022-11-18 LAB — HEMOGLOBIN, FINGERSTICK: Hemoglobin: 11.5 g/dL (ref 11.1–15.9)

## 2022-11-18 NOTE — Progress Notes (Signed)
[facility]  PRENATAL VISIT NOTE  Subjective:  Teresa [name] is a 22 y.o. G2P0010 at [redacted]w[redacted]d being seen today for ongoing prenatal care.  She is currently monitored for the following issues for this low-risk pregnancy and has Supervision of other normal pregnancy, antepartum; History of non-nicotine vaping; Late prenatal care 15 4/7; and Vomiting affecting pregnancy on their problem list.  Patient reports no complaints.  Contractions: Not present. Vag. Bleeding: None.  Movement: Present. Denies leaking of fluid/ROM.   The following portions of the patient's history were reviewed and updated as appropriate: allergies, current medications, past family history, past medical history, past social history, past surgical history and problem list. Problem list updated.  Objective:   Vitals:   11/18/22 1522  BP: 107/67  Pulse: 84  Temp: 98.3 F (36.8 C)  Weight: 190 lb 12.8 oz (86.5 kg)    Fetal Status: Fetal Heart Rate (bpm): 135 Fundal Height: 27 cm Movement: Present     General:  Alert, oriented and cooperative. Patient is in no acute distress.  Skin: Skin is warm and dry. No rash noted.   Cardiovascular: Normal heart rate noted  Respiratory: Normal respiratory effort, no problems with respiration noted  Abdomen: Soft, gravid, appropriate for gestational age.  Pain/Pressure: Absent     Pelvic: Cervical exam deferred        Extremities: Normal range of motion.  Edema: None  Mental Status: Normal mood and affect. Normal behavior. Normal judgment and thought content.   Assessment and Plan:  Pregnancy: G2P0010 at [redacted]w[redacted]d  1. Supervision of other normal pregnancy, antepartum 10 lb 12.8 oz (4.899 kg) 10 lb wt gain in last 4 wks Not working [place] 2x/day 4x/wk x 5-10 min [facility] 10/15/22 u/s Reviewed 11/16/22 u/s at 27 6/7 with anterior placenta, no previa, EFW=67%, 3VC, anatomy wnl 1 hour glucola today  - Hemoglobin,  fingerstick - Glucose, 1 hour gestational - HIV-1/HIV-2 Qualitative RNA - RPR - Tdap vaccine greater than or equal to 7yo IM   Preterm labor symptoms and general obstetric precautions including but not limited to vaginal bleeding, contractions, leaking of fluid and fetal movement were reviewed in detail with the patient. Please refer to After Visit Summary for other counseling recommendations.  Return in about 2 weeks (around 12/02/2022) for routine PNC.  No future appointments.  Alberteen Spindle, CNM

## 2022-11-18 NOTE — Progress Notes (Signed)
Hgb reviewed - no treatment indicated.   Artina Minella Ramos, RN  

## 2022-11-18 NOTE — Progress Notes (Signed)
Patient here for MH RV at [redacted]w[redacted]d.   28 week labs completed today.

## 2022-11-20 LAB — HIV-1/HIV-2 QUALITATIVE RNA
HIV-1 RNA, Qualitative: NONREACTIVE
HIV-2 RNA, Qualitative: NONREACTIVE

## 2022-11-20 LAB — RPR: RPR Ser Ql: NONREACTIVE

## 2022-11-20 LAB — GLUCOSE, 1 HOUR GESTATIONAL: Gestational Diabetes Screen: 84 mg/dL (ref 70–139)

## 2022-12-02 ENCOUNTER — Ambulatory Visit: Payer: Medicaid Other | Admitting: Advanced Practice Midwife

## 2022-12-02 VITALS — BP 106/67 | HR 73 | Temp 97.7°F | Wt 191.8 lb

## 2022-12-02 DIAGNOSIS — O0930 Supervision of pregnancy with insufficient antenatal care, unspecified trimester: Secondary | ICD-10-CM

## 2022-12-02 DIAGNOSIS — Z3480 Encounter for supervision of other normal pregnancy, unspecified trimester: Secondary | ICD-10-CM

## 2022-12-02 NOTE — Progress Notes (Signed)
[employer] utilized ID # [id number]. BTHIELE RN

## 2022-12-02 NOTE — Progress Notes (Signed)
[facility]  PRENATAL VISIT NOTE  Subjective:  Teresa [name] is a 22 y.o. G2P0010 at [redacted]w[redacted]d being seen today for ongoing prenatal care.  She is currently monitored for the following issues for this low-risk pregnancy and has Supervision of other normal pregnancy, antepartum; History of non-nicotine vaping; and Late prenatal care 15 4/7 on their problem list.  Patient reports no complaints.  Contractions: Not present. Vag. Bleeding: None.  Movement: Present. Denies leaking of fluid/ROM.   The following portions of the patient's history were reviewed and updated as appropriate: allergies, current medications, past family history, past medical history, past social history, past surgical history and problem list. Problem list updated.  Objective:   Vitals:   12/02/22 1550  BP: 106/67  Pulse: 73  Temp: 97.7 F (36.5 C)  Weight: 191 lb 12.8 oz (87 kg)    Fetal Status: Fetal Heart Rate (bpm): 137 Fundal Height: 31 cm Movement: Present     General:  Alert, oriented and cooperative. Patient is in no acute distress.  Skin: Skin is warm and dry. No rash noted.   Cardiovascular: Normal heart rate noted  Respiratory: Normal respiratory effort, no problems with respiration noted  Abdomen: Soft, gravid, appropriate for gestational age.  Pain/Pressure: Absent     Pelvic: Cervical exam deferred        Extremities: Normal range of motion.  Edema: None  Mental Status: Normal mood and affect. Normal behavior. Normal judgment and thought content.   Assessment and Plan:  Pregnancy: G2P0010 at [redacted]w[redacted]d  1. 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 Late prenatal care 15 4/7    Preterm labor symptoms and general obstetric precautions including but not limited to vaginal bleeding, contractions,  leaking of fluid and fetal movement were reviewed in detail with the patient. Please refer to After Visit Summary for other counseling recommendations.  Return in about 2 weeks (around 12/16/2022) for routine PNC.  No future appointments.  Alberteen Spindle, CNM

## 2022-12-17 ENCOUNTER — Encounter: Payer: Self-pay | Admitting: Physician Assistant

## 2022-12-17 ENCOUNTER — Ambulatory Visit: Payer: Medicaid Other | Admitting: Physician Assistant

## 2022-12-17 VITALS — BP 103/60 | HR 64 | Temp 97.0°F | Wt 198.4 lb

## 2022-12-17 DIAGNOSIS — Z3A31 31 weeks gestation of pregnancy: Secondary | ICD-10-CM

## 2022-12-17 DIAGNOSIS — Z3480 Encounter for supervision of other normal pregnancy, unspecified trimester: Secondary | ICD-10-CM

## 2022-12-17 NOTE — Progress Notes (Signed)
[employer] [place] [id number] [phone]. BTHIELE RN

## 2022-12-17 NOTE — Progress Notes (Signed)
[facility]  PRENATAL VISIT NOTE  Subjective:  Teresa [name] is a 22 y.o. G2P0010 at [redacted]w[redacted]d being seen today with her husband for ongoing prenatal care.  She is currently monitored for the following issues for this low-risk pregnancy and has Supervision of other normal pregnancy, antepartum; History of non-nicotine vaping; and Late prenatal care 15 4/7 on their problem list.  Patient reports  1 contraction 2 weeks ago, and another brief one yesterday .  Contractions: Not present. Vag. Bleeding: None.  Movement: Present. Denies leaking of fluid/ROM.   The following portions of the patient's history were reviewed and updated as appropriate: allergies, current medications, past family history, past medical history, past social history, past surgical history and problem list. Problem list updated.  Objective:   Vitals:   12/17/22 1415  BP: 103/60  Pulse: 64  Temp: (!) 97 F (36.1 C)  Weight: 198 lb 6.4 oz (90 kg)    Fetal Status: Fetal Heart Rate (bpm): 142 Fundal Height: 32 cm Movement: Present     General:  Alert, oriented and cooperative. Patient is in no acute distress.  Skin: Skin is warm and dry. No rash noted.   Cardiovascular: Normal heart rate noted  Respiratory: Normal respiratory effort, no problems with respiration noted  Abdomen: Soft, gravid, appropriate for gestational age.  Pain/Pressure: Absent     Pelvic: Cervical exam deferred        Extremities: Normal range of motion.  Edema: None  Mental Status: Normal mood and affect. Normal behavior. Normal judgment and thought content.   Assessment and Plan:  Pregnancy: G2P0010 at [redacted]w[redacted]d  1. Supervision of other normal pregnancy, antepartum Enc po hydration to prevent uterine irritability, reviewed PTL sx. Continue prenatal vitamins. Has infant carseat.  2. [redacted] weeks gestation of pregnancy Anticipatory guidance re: visits every 2 weeks til 36 wk, then weekly til  delivery.   Preterm labor symptoms and general obstetric precautions including but not limited to vaginal bleeding, contractions, leaking of fluid and fetal movement were reviewed in detail with the patient. Due to language barrier, an interpreter Rachel Bo T.) was present during the history-taking and subsequent discussion (and for the physical exam) with this patient.  Please refer to After Visit Summary for other counseling recommendations.  Return in about 2 weeks (around 12/31/2022) for Routine prenatal care.  Future Appointments  Date Time Provider Department Center  12/31/2022  1:40 PM AC-MH PROVIDER AC-MAT None    Landry Dyke, PA-C

## 2022-12-31 ENCOUNTER — Ambulatory Visit: Payer: Medicaid Other | Admitting: Physician Assistant

## 2022-12-31 VITALS — BP 99/71 | HR 70 | Temp 97.0°F | Wt 201.2 lb

## 2022-12-31 DIAGNOSIS — Z3480 Encounter for supervision of other normal pregnancy, unspecified trimester: Secondary | ICD-10-CM

## 2022-12-31 DIAGNOSIS — Z3483 Encounter for supervision of other normal pregnancy, third trimester: Secondary | ICD-10-CM

## 2022-12-31 DIAGNOSIS — Z3A33 33 weeks gestation of pregnancy: Secondary | ICD-10-CM

## 2022-12-31 NOTE — Progress Notes (Signed)
Patient here for MH RV at 33 5/7. Marland KitchenBurt Knack, RN

## 2022-12-31 NOTE — Progress Notes (Signed)
[facility]  PRENATAL VISIT NOTE  Subjective:  Teresa [name] is a 22 y.o. G2P0010 at [redacted]w[redacted]d being seen today for ongoing prenatal care.  She is currently monitored for the following issues for this low-risk pregnancy and has Supervision of other normal pregnancy, antepartum; History of non-nicotine vaping; and Late prenatal care 15 4/7 on their problem list.  Patient reports  discomfort deep in pelvis last week, often at rest - no pain today .  Contractions: Not present. Vag. Bleeding: None.  Movement: Present. Denies leaking of fluid/ROM.   The following portions of the patient's history were reviewed and updated as appropriate: allergies, current medications, past family history, past medical history, past social history, past surgical history and problem list. Problem list updated.  Objective:   Vitals:   12/31/22 1337  BP: 99/71  Pulse: 70  Temp: (!) 97 F (36.1 C)  Weight: 201 lb 3.2 oz (91.3 kg)    Fetal Status: Fetal Heart Rate (bpm): 142 Fundal Height: 33 cm Movement: Present     General:  Alert, oriented and cooperative. Patient is in no acute distress.  Skin: Skin is warm and dry. No rash noted.   Cardiovascular: Normal heart rate noted  Respiratory: Normal respiratory effort, no problems with respiration noted  Abdomen: Soft, gravid, appropriate for gestational age.  Pain/Pressure: Absent     Pelvic: Cervical exam deferred        Extremities: Normal range of motion.  Edema: None  Mental Status: Normal mood and affect. Normal behavior. Normal judgment and thought content.   Assessment and Plan:  Pregnancy: G2P0010 at [redacted]w[redacted]d  1. Supervision of other normal pregnancy, antepartum Suspect pelvic ligament pain - reassurance given. Enc po hydration to prevent dehydration in light of hot weather. Had recent baby shower and now has all infant essentials.  2. [redacted] weeks gestation of pregnancy Anticipatory guidance re:  transition to weekly visits after next appt in 2 weeks.   Preterm labor symptoms and general obstetric precautions including but not limited to vaginal bleeding, contractions, leaking of fluid and fetal movement were reviewed in detail with the patient. Due to language barrier, an interpreter (M. [place]) was present during the history-taking and subsequent discussion (and the physical exam) with this patient.' Please refer to After Visit Summary for other counseling recommendations.  Return in about 2 weeks (around 01/14/2023) for Routine prenatal care.  Future Appointments  Date Time Provider Department Center  01/14/2023  1:20 PM AC-MH PROVIDER AC-MAT None    Landry Dyke, PA-C

## 2023-01-12 NOTE — Addendum Note (Signed)
Addended by: Heywood Bene on: 01/12/2023 01:51 PM   Modules accepted: Orders

## 2023-01-14 ENCOUNTER — Ambulatory Visit: Payer: Medicaid Other | Admitting: Physician Assistant

## 2023-01-14 ENCOUNTER — Encounter: Payer: Self-pay | Admitting: Physician Assistant

## 2023-01-14 VITALS — BP 94/63 | HR 91 | Temp 98.1°F | Wt 201.0 lb

## 2023-01-14 DIAGNOSIS — Z3480 Encounter for supervision of other normal pregnancy, unspecified trimester: Secondary | ICD-10-CM

## 2023-01-14 DIAGNOSIS — Z3483 Encounter for supervision of other normal pregnancy, third trimester: Secondary | ICD-10-CM

## 2023-01-14 DIAGNOSIS — Z3A35 35 weeks gestation of pregnancy: Secondary | ICD-10-CM

## 2023-01-14 NOTE — Progress Notes (Signed)
[facility]  PRENATAL VISIT NOTE  Subjective:  Teresa [name] is a 22 y.o. G2P0010 at [redacted]w[redacted]d being seen today for ongoing prenatal care.  She is currently monitored for the following issues for this low-risk pregnancy and has Supervision of other normal pregnancy, antepartum; History of non-nicotine vaping; and Late prenatal care 15 4/7 on their problem list.  Patient reports  leaking clear fluid from vagina for 1 week, no odor/itch. Does not believe it is urine .  Contractions: Not present. Vag. Bleeding: None.  Movement: AbsentMovement: Present (verified fetal movement is active AFS 01/14/23)  The following portions of the patient's history were reviewed and updated as appropriate: allergies, current medications, past family history, past medical history, past social history, past surgical history and problem list. Problem list updated.  Objective:   Vitals:   01/14/23 1343  BP: 94/63  Pulse: 91  Temp: 98.1 F (36.7 C)  Weight: 201 lb (91.2 kg)    Fetal Status: Fetal Heart Rate (bpm): 155 Fundal Height: 35 cm Movement: Absent  Presentation: Vertex  General:  Alert, oriented and cooperative. Patient is in no acute distress.  Skin: Skin is warm and dry. No rash noted.   Cardiovascular: Normal heart rate noted  Respiratory: Normal respiratory effort, no problems with respiration noted  Abdomen: Soft, gravid, appropriate for gestational age.  Pain/Pressure: Absent     Pelvic: Cervical exam deferred      SVE: mod amt thin white vag discharge in vagina and on vaginal walls, no pooling, no fluid emanating from os. pH 4.0.  Extremities: Normal range of motion.  Edema: None  Mental Status: Normal mood and affect. Normal behavior. Normal judgment and thought content.   Assessment and Plan:  Pregnancy: G2P0010 at [redacted]w[redacted]d  1. Supervision of other normal pregnancy, antepartum Evaluated for possible ROM, exam not consistent with ROM, suspect  increased vag discharge of late pregnancy.  2. [redacted] weeks gestation of pregnancy Continue weekly visits until delivery. Anticipatory guidance re: routine rectovaginal testing at RV.   Preterm labor symptoms and general obstetric precautions including but not limited to vaginal bleeding, contractions, leaking of fluid and fetal movement were reviewed in detail with the patient. Please refer to After Visit Summary for other counseling recommendations.  Due to language barrier, an interpreter Rachel Bo T.) was present during the history-taking and subsequent discussion (and the physical exam) with this patient.  Return in about 1 week (around 01/21/2023) for Routine prenatal care.  Future Appointments  Date Time Provider Department Center  01/21/2023  8:20 AM AC-MH PROVIDER AC-MAT None    Landry Dyke, PA-C

## 2023-01-14 NOTE — Progress Notes (Signed)
!   Week MHC RV appt scheduled and appt reminder card given to client. Jossie Ng, RN

## 2023-01-21 ENCOUNTER — Encounter: Payer: Self-pay | Admitting: Physician Assistant

## 2023-01-21 ENCOUNTER — Ambulatory Visit: Payer: Medicaid Other | Admitting: Physician Assistant

## 2023-01-21 VITALS — BP 98/67 | HR 79 | Temp 98.0°F | Wt 203.8 lb

## 2023-01-21 DIAGNOSIS — Z3480 Encounter for supervision of other normal pregnancy, unspecified trimester: Secondary | ICD-10-CM | POA: Diagnosis not present

## 2023-01-21 DIAGNOSIS — Z3A36 36 weeks gestation of pregnancy: Secondary | ICD-10-CM

## 2023-01-21 DIAGNOSIS — Z3483 Encounter for supervision of other normal pregnancy, third trimester: Secondary | ICD-10-CM

## 2023-01-21 NOTE — Progress Notes (Addendum)
[facility]  PRENATAL VISIT NOTE  Subjective:  Teresa [name] is a 22 y.o. G2P0010 at [redacted]w[redacted]d being seen today for ongoing prenatal care.  She is currently monitored for the following issues for this low-risk pregnancy and has Supervision of other normal pregnancy, antepartum; History of non-nicotine vaping; and Late prenatal care 15 4/7 on their problem list.  Patient reports occasional contractions.  Contractions: Irritability. Vag. Bleeding: None.  Movement: Present. Had a few low cramps since yesterday, very brief, last about 4 hours ago. Denies leaking of fluid/ROM.   The following portions of the patient's history were reviewed and updated as appropriate: allergies, current medications, past family history, past medical history, past social history, past surgical history and problem list. Problem list updated.  Objective:   Vitals:   01/21/23 0840  BP: 98/67  Pulse: 79  Temp: 98 F (36.7 C)  Weight: 203 lb 12.8 oz (92.4 kg)    Fetal Status: Fetal Heart Rate (bpm): 136 Fundal Height: 37 cm Movement: Present  Presentation: Vertex  General:  Alert, oriented and cooperative. Patient is in no acute distress.  Skin: Skin is warm and dry. No rash noted.   Cardiovascular: Normal heart rate noted  Respiratory: Normal respiratory effort, no problems with respiration noted  Abdomen: Soft, gravid, appropriate for gestational age.  Pain/Pressure: Absent     Pelvic: Cervical exam deferred        Extremities: Normal range of motion.  Edema: None  Mental Status: Normal mood and affect. Normal behavior. Normal judgment and thought content.   Assessment and Plan:  Pregnancy: G2P0010 at [redacted]w[redacted]d  1. Supervision of other normal pregnancy, antepartum Doubt early labor - enc to push fluids and monitor sx. Routine rectovaginal testing today. Addendum: PHQ-9 score = 5 today. - Chlamydia/GC NAA, Confirmation - Culture, beta strep (group b  only)  2. [redacted] weeks gestation of pregnancy Continue weekly visits for now.   Preterm labor symptoms and general obstetric precautions including but not limited to vaginal bleeding, contractions, leaking of fluid and fetal movement were reviewed in detail with the patient. Please refer to After Visit Summary for other counseling recommendations.  Due to language barrier, an interpreter (M. [place]) was present during the history-taking and subsequent discussion (and the physical exam) with this patient.  Return in about 1 week (around 01/28/2023) for Routine prenatal care.  Future Appointments  Date Time Provider Department Center  01/28/2023  8:20 AM AC-MH PROVIDER AC-MAT None  02/04/2023  8:20 AM AC-MH PROVIDER AC-MAT None  02/10/2023  8:40 AM AC-MH PROVIDER AC-MAT None    Landry Dyke, PA-C

## 2023-01-21 NOTE — Progress Notes (Addendum)
Declined self-collection of 36 week cultures. Jossie Ng, RN Weekly MHC RV appts x3 scheduled and appt reminder card given for each. Jossie Ng, RN

## 2023-01-25 LAB — CHLAMYDIA/GC NAA, CONFIRMATION
Chlamydia trachomatis, NAA: NEGATIVE
Neisseria gonorrhoeae, NAA: NEGATIVE

## 2023-01-25 LAB — CULTURE, BETA STREP (GROUP B ONLY): Strep Gp B Culture: NEGATIVE

## 2023-01-28 ENCOUNTER — Ambulatory Visit: Payer: Medicaid Other | Admitting: Physician Assistant

## 2023-01-28 ENCOUNTER — Encounter: Payer: Self-pay | Admitting: Physician Assistant

## 2023-01-28 VITALS — BP 103/74 | HR 66 | Temp 97.7°F | Wt 206.0 lb

## 2023-01-28 DIAGNOSIS — Z3483 Encounter for supervision of other normal pregnancy, third trimester: Secondary | ICD-10-CM

## 2023-01-28 DIAGNOSIS — Z3480 Encounter for supervision of other normal pregnancy, unspecified trimester: Secondary | ICD-10-CM

## 2023-01-28 DIAGNOSIS — Z3A37 37 weeks gestation of pregnancy: Secondary | ICD-10-CM

## 2023-01-28 NOTE — Progress Notes (Signed)
[facility]  PRENATAL VISIT NOTE  Subjective:  Teresa [name] is a 22 y.o. G2P0010 at [redacted]w[redacted]d being seen today for ongoing prenatal care.  She is currently monitored for the following issues for this low-risk pregnancy and has Supervision of other normal pregnancy, antepartum; History of non-nicotine vaping; and Late prenatal care 15 4/7 on their problem list.  Patient reports  occ pelvic cramping .  Contractions: Irritability. Vag. Bleeding: None.  Movement: Present. Denies leaking of fluid/ROM.   The following portions of the patient's history were reviewed and updated as appropriate: allergies, current medications, past family history, past medical history, past social history, past surgical history and problem list. Problem list updated.  Objective:   Vitals:   01/28/23 0828  BP: 103/74  Pulse: 66  Temp: 97.7 F (36.5 C)  Weight: 206 lb (93.4 kg)    Fetal Status: Fetal Heart Rate (bpm): 150 Fundal Height: 37 cm Movement: Present  Presentation: Vertex  General:  Alert, oriented and cooperative. Patient is in no acute distress.  Skin: Skin is warm and dry. No rash noted.   Cardiovascular: Normal heart rate noted  Respiratory: Normal respiratory effort, no problems with respiration noted  Abdomen: Soft, gravid, appropriate for gestational age.  Pain/Pressure: Absent     Pelvic: Cervical exam deferred        Extremities: Normal range of motion.  Edema: None  Mental Status: Normal mood and affect. Normal behavior. Normal judgment and thought content.   Assessment and Plan:  Pregnancy: G2P0010 at [redacted]w[redacted]d  1. Supervision of other normal pregnancy, antepartum Doing well. Enc po liquids. Feels ready for baby. TWG 26 lb, appropriate.   2. [redacted] weeks gestation of pregnancy Continue weekly visits.   Term labor symptoms and general obstetric precautions including but not limited to vaginal bleeding, contractions, leaking of fluid and  fetal movement were reviewed in detail with the patient. Please refer to After Visit Summary for other counseling recommendations.  Due to language barrier, an interpreter Teresa Bo T.) was present during the history-taking and subsequent discussion (and for the physical exam) with this patient.  Return in about 1 week (around 02/04/2023) for Routine prenatal care.  Future Appointments  Date Time Provider Department Center  02/04/2023  8:20 AM AC-MH PROVIDER AC-MAT None  02/10/2023  8:40 AM AC-MH PROVIDER AC-MAT None    Landry Dyke, PA-C

## 2023-01-28 NOTE — Progress Notes (Signed)
Patient here for MH RV at 37 5/7. Marland KitchenBurt Knack, RN

## 2023-02-04 ENCOUNTER — Ambulatory Visit: Payer: Medicaid Other | Admitting: Family Medicine

## 2023-02-04 VITALS — BP 104/68 | HR 88 | Temp 97.4°F | Wt 205.8 lb

## 2023-02-04 DIAGNOSIS — Z3480 Encounter for supervision of other normal pregnancy, unspecified trimester: Secondary | ICD-10-CM

## 2023-02-04 DIAGNOSIS — Z3A38 38 weeks gestation of pregnancy: Secondary | ICD-10-CM

## 2023-02-04 DIAGNOSIS — Z3483 Encounter for supervision of other normal pregnancy, third trimester: Secondary | ICD-10-CM

## 2023-02-04 NOTE — Progress Notes (Signed)
Patient here for MH RV at 38 5/7. .Valerio Pinard Brewer-Jensen, RN 

## 2023-02-04 NOTE — Progress Notes (Signed)
[facility]  PRENATAL VISIT NOTE  Subjective:  Teresa [name] is a 22 y.o. G2P0010 at [redacted]w[redacted]d being seen today for ongoing prenatal care.  She is currently monitored for the following issues for this low-risk pregnancy and has Supervision of other normal pregnancy, antepartum; History of non-nicotine vaping; and Late prenatal care 15 4/7 on their problem list.  Patient reports no complaints.  Contractions: Irregular. Vag. Bleeding: None.  Movement: Present. Denies leaking of fluid/ROM.   The following portions of the patient's history were reviewed and updated as appropriate: allergies, current medications, past family history, past medical history, past social history, past surgical history and problem list. Problem list updated.  Objective:   Vitals:   02/04/23 0824  BP: 104/68  Pulse: 88  Temp: (!) 97.4 F (36.3 C)  Weight: 205 lb 12.8 oz (93.4 kg)    Fetal Status: Fetal Heart Rate (bpm): 131 Fundal Height: 38 cm Movement: Present  Presentation: Vertex  General:  Alert, oriented and cooperative. Patient is in no acute distress.  Skin: Skin is warm and dry. No rash noted.   Cardiovascular: Normal heart rate noted  Respiratory: Normal respiratory effort, no problems with respiration noted  Abdomen: Soft, gravid, appropriate for gestational age.  Pain/Pressure: Present     Pelvic: Cervical exam deferred        Extremities: Normal range of motion.  Edema: None  Mental Status: Normal mood and affect. Normal behavior. Normal judgment and thought content.   Assessment and Plan:  Pregnancy: G2P0010 at 109w5d  1. Supervision of other normal pregnancy, antepartum -taking PNV daily 25 lb 12.8 oz (11.7 kg) -pt had questions regarding induction at 41 weeks leading to c/s -reviewed methods of induction -at RV on Wed, discussed option for membrane sweep and cervical exam   2. [redacted] weeks gestation of pregnancy IOL papers and cervical  check at RV   Term labor symptoms and general obstetric precautions including but not limited to vaginal bleeding, contractions, leaking of fluid and fetal movement were reviewed in detail with the patient. Please refer to After Visit Summary for other counseling recommendations.  Return in about 1 week (around 02/11/2023) for Routine Prenatal Care.  Future Appointments  Date Time Provider Department Center  02/10/2023  8:40 AM AC-MH PROVIDER AC-MAT None  02/17/2023  9:40 AM AC-MH PROVIDER AC-MAT None  Due to language barrier, interpreter Estill Dooms was present for this visit.   Lenice Llamas, [place]

## 2023-02-09 NOTE — Progress Notes (Unsigned)
[facility] Department Maternal Health Clinic  PRENATAL VISIT NOTE  Subjective:  Teresa [name] is a 22 y.o. G2P0010 at [redacted]w[redacted]d being seen today for ongoing prenatal care.  She is currently monitored for the following issues for this low-risk pregnancy and has Supervision of other normal pregnancy, antepartum; History of non-nicotine vaping; and Late prenatal care 15 4/7 on their problem list.  Patient reports {sx:14538}.   .  .   . Denies leaking of fluid/ROM.   The following portions of the patient's history were reviewed and updated as appropriate: allergies, current medications, past family history, past medical history, past social history, past surgical history and problem list. Problem list updated.  Objective:  There were no vitals filed for this visit.  Fetal Status:           General:  Alert, oriented and cooperative. Patient is in no acute distress.  Skin: Skin is warm and dry. No rash noted.   Cardiovascular: Normal heart rate noted  Respiratory: Normal respiratory effort, no problems with respiration noted  Abdomen: Soft, gravid, appropriate for gestational age.        Pelvic: {Blank single:19197::"Cervical exam performed","Cervical exam deferred"}        Extremities: Normal range of motion.     Mental Status: Normal mood and affect. Normal behavior. Normal judgment and thought content.   Assessment and Plan:  Pregnancy: G2P0010 at [redacted]w[redacted]d  1. Supervision of other normal pregnancy, antepartum ***  2. [redacted] weeks gestation of pregnancy -IOL and cervical exam offered today   Term labor symptoms and general obstetric precautions including but not limited to vaginal bleeding, contractions, leaking of fluid and fetal movement were reviewed in detail with the patient. Please refer to After Visit Summary for other counseling recommendations.  No follow-ups on file.  Future Appointments  Date Time Provider Department Center  02/10/2023  8:40 AM AC-MH PROVIDER  AC-MAT None  02/17/2023  9:40 AM AC-MH PROVIDER AC-MAT None   Due to language barrier, interpreter *** was present for this visit.  Lenice Llamas, [place]

## 2023-02-10 ENCOUNTER — Other Ambulatory Visit: Payer: Self-pay | Admitting: Obstetrics and Gynecology

## 2023-02-10 ENCOUNTER — Ambulatory Visit: Payer: Medicaid Other | Admitting: Family Medicine

## 2023-02-10 VITALS — BP 99/66 | HR 66 | Temp 97.3°F | Wt 208.6 lb

## 2023-02-10 DIAGNOSIS — Z3480 Encounter for supervision of other normal pregnancy, unspecified trimester: Secondary | ICD-10-CM

## 2023-02-10 DIAGNOSIS — Z3483 Encounter for supervision of other normal pregnancy, third trimester: Secondary | ICD-10-CM

## 2023-02-10 DIAGNOSIS — Z3A39 39 weeks gestation of pregnancy: Secondary | ICD-10-CM

## 2023-02-10 DIAGNOSIS — Z3490 Encounter for supervision of normal pregnancy, unspecified, unspecified trimester: Secondary | ICD-10-CM

## 2023-02-10 NOTE — Progress Notes (Signed)
Here today for 39.4 week MH RV. Taking PNV every day. Denies ED/hospital visits since last RV. Cervical check and IOL form today. Tawny Hopping, RN

## 2023-02-12 ENCOUNTER — Encounter: Payer: Self-pay | Admitting: Obstetrics and Gynecology

## 2023-02-12 ENCOUNTER — Telehealth: Payer: Self-pay

## 2023-02-12 NOTE — Telephone Encounter (Signed)
Fax received from [facility] this am with IOL date of 02/21/2023 at 12 midnight. Call to L & D to verify arrival date / time and client is to arrive to ED at 2355 and will be escorted to L & D.   Call to Marlene Yemen to verify aware of IOL appt. Left message to call with number provided. Jossie Ng, RN

## 2023-02-12 NOTE — Telephone Encounter (Signed)
To arrive at ED 2355 on 02/21/23. Jossie Ng, RN

## 2023-02-13 ENCOUNTER — Encounter: Payer: Self-pay | Admitting: Obstetrics and Gynecology

## 2023-02-13 ENCOUNTER — Other Ambulatory Visit: Payer: Self-pay

## 2023-02-13 ENCOUNTER — Observation Stay
Admission: EM | Admit: 2023-02-13 | Discharge: 2023-02-13 | Disposition: A | Discharge status: Home / Self Care | Payer: Medicaid Other | Attending: Obstetrics and Gynecology | Admitting: Obstetrics and Gynecology

## 2023-02-13 DIAGNOSIS — O480 Post-term pregnancy: Secondary | ICD-10-CM

## 2023-02-13 DIAGNOSIS — O26893 Other specified pregnancy related conditions, third trimester: Secondary | ICD-10-CM | POA: Diagnosis not present

## 2023-02-13 DIAGNOSIS — Z3A40 40 weeks gestation of pregnancy: Secondary | ICD-10-CM | POA: Diagnosis not present

## 2023-02-13 DIAGNOSIS — O471 False labor at or after 37 completed weeks of gestation: Principal | ICD-10-CM | POA: Insufficient documentation

## 2023-02-13 DIAGNOSIS — R109 Unspecified abdominal pain: Secondary | ICD-10-CM | POA: Diagnosis not present

## 2023-02-13 NOTE — Final Progress Note (Signed)
L&D OB Triage Note  HPI:  Teresa [name] is a 22 y.o. G2P0010 female at [redacted]w[redacted]d with Estimated Date of Delivery: 02/13/23 who presents for complaints of irregular contractions, q 5-10 mi apart for the past several hours. Notes that the contractions are mild. Denies vaginal bleeding, LOF, and notes good fetal movement. Receives [facility] at [facility]. Of note, patient is scheduled for IOL on 7/14 for postdates pregnancy.    OB History  Gravida Para Term Preterm AB Living  2 0 0 0 1 0  SAB IAB Ectopic Multiple Live Births  0 0 0 0 0    # Outcome Date GA Lbr Len/2nd Weight Sex Delivery Anes PTL Lv  2 Current           1 AB 12/20/21 [redacted]w[redacted]d    SAB       Patient Active Problem List   Diagnosis Date Noted   Indication for care in labor or delivery 02/13/2023   Late prenatal care 15 4/7 07/30/2022   Supervision of other normal pregnancy, antepartum 07/29/2022   History of non-nicotine vaping 07/29/2022    Past Medical History:  Diagnosis Date   Abnormal blood sugar    Physician in [place] said her "sugar was high" x1; no issues since   History of constipation    age 79 - 15 years   History of constipation 07/29/2022    No current facility-administered medications on file prior to encounter.   Current Outpatient Medications on File Prior to Encounter  Medication Sig Dispense Refill   Prenatal Vit-Fe Fumarate-FA (PRENATAL VITAMINS) 28-0.8 MG TABS Take 1 tablet by mouth daily. 100 tablet 0    No Known Allergies   ROS:  Review of Systems - Negative except what is noted in HPI.    Physical Exam:  Blood Pressure 120/70,  Pulse 65,  Height 5' 6.93" (1.7 m), weight 94.6 kg, last menstrual period 04/11/2022. Body mass index is 32.74 kg/m.  General appearance: alert and no distress Pelvis: Dilation: 1.5 Effacement (%): 70, 80 Cervical Position: Posterior Station: -2 Presentation: Vertex Exam by:: LSE   NST INTERPRETATION: Indications: rule out uterine contractions  Mode:  External Baseline Rate (A): 135 bpm Variability: Moderate Accelerations: 15 x 15 Decelerations: None     Contraction Frequency (min): 1-4  Impression: reactive   Assessment:  22 y.o. G2P0010 at [redacted]w[redacted]d with:  1.  Irregular uterine contractions    Plan:  She was evaluated by the nurses with no significant findingsfindings significant for labor. Vital signs stable. An NST was performed and has been reviewed by MD. Labor eval with no cervical change noted.  She was discharged home with bleeding/labor precautions.  Continue routine prenatal care. Follow up for routine [facility] with [facility].     Hildred Laser, MD [facility] at [facility]

## 2023-02-13 NOTE — OB Triage Note (Signed)
Ctx since 0800 this am. Denies LOF or vaginal bleeding. Reports + fetal movement. Elaina Hoops

## 2023-02-13 NOTE — OB Triage Note (Signed)
Discharge home. Reviewed discharge instruction through interpreter. Reports no questions. Teresa [name]

## 2023-02-14 ENCOUNTER — Other Ambulatory Visit: Payer: Self-pay

## 2023-02-14 ENCOUNTER — Encounter: Payer: Self-pay | Admitting: Obstetrics and Gynecology

## 2023-02-14 ENCOUNTER — Inpatient Hospital Stay
Admission: EM | Admit: 2023-02-14 | Discharge: 2023-02-16 | DRG: 807 | Disposition: A | Discharge status: Home / Self Care | Payer: Medicaid Other | Attending: Certified Nurse Midwife | Admitting: Certified Nurse Midwife

## 2023-02-14 DIAGNOSIS — Z3490 Encounter for supervision of normal pregnancy, unspecified, unspecified trimester: Secondary | ICD-10-CM

## 2023-02-14 DIAGNOSIS — Z3A40 40 weeks gestation of pregnancy: Secondary | ICD-10-CM

## 2023-02-14 DIAGNOSIS — O480 Post-term pregnancy: Principal | ICD-10-CM | POA: Diagnosis present

## 2023-02-14 NOTE — OB Triage Note (Signed)
Pt arrives with ctx's which she is breathing through at this time. CNM Thompson aware.

## 2023-02-15 ENCOUNTER — Encounter: Payer: Self-pay | Admitting: Obstetrics and Gynecology

## 2023-02-15 DIAGNOSIS — Z3A40 40 weeks gestation of pregnancy: Secondary | ICD-10-CM | POA: Diagnosis not present

## 2023-02-15 DIAGNOSIS — O480 Post-term pregnancy: Secondary | ICD-10-CM | POA: Diagnosis present

## 2023-02-15 DIAGNOSIS — Z3483 Encounter for supervision of other normal pregnancy, third trimester: Secondary | ICD-10-CM | POA: Diagnosis not present

## 2023-02-15 LAB — CBC
HCT: 38.6 % (ref 36.0–46.0)
Hemoglobin: 13.1 g/dL (ref 12.0–15.0)
MCH: 29.1 pg (ref 26.0–34.0)
MCHC: 33.9 g/dL (ref 30.0–36.0)
MCV: 85.8 fL (ref 80.0–100.0)
Platelets: 190 10*3/uL (ref 150–400)
RBC: 4.5 MIL/uL (ref 3.87–5.11)
RDW: 12.5 % (ref 11.5–15.5)
WBC: 8.9 10*3/uL (ref 4.0–10.5)
nRBC: 0 % (ref 0.0–0.2)

## 2023-02-15 LAB — TYPE AND SCREEN
ABO/RH(D): A POS
Antibody Screen: NEGATIVE

## 2023-02-15 LAB — RPR: RPR Ser Ql: NONREACTIVE

## 2023-02-15 MED ORDER — DIBUCAINE (PERIANAL) 1 % EX OINT: 1.0000 | TOPICAL_OINTMENT | CUTANEOUS | Status: DC | PRN

## 2023-02-15 MED ORDER — ONDANSETRON HCL 4 MG/2ML IJ SOLN: 4.0000 mg | Freq: Four times a day (QID) | INTRAMUSCULAR | Status: DC | PRN

## 2023-02-15 MED ORDER — OXYTOCIN 10 UNIT/ML IJ SOLN
INTRAMUSCULAR | Status: AC
  Filled 2023-02-15: qty 2

## 2023-02-15 MED ORDER — LACTATED RINGERS IV BOLUS
1000.0000 mL | Freq: Once | INTRAVENOUS | Status: AC
  Administered 2023-02-15: 1000 mL via INTRAVENOUS

## 2023-02-15 MED ORDER — TETANUS-DIPHTH-ACELL PERTUSSIS 5-2.5-18.5 LF-MCG/0.5 IM SUSY: 0.5000 mL | PREFILLED_SYRINGE | Freq: Once | INTRAMUSCULAR | Status: DC

## 2023-02-15 MED ORDER — OXYTOCIN-SODIUM CHLORIDE 30-0.9 UT/500ML-% IV SOLN
INTRAVENOUS | Status: AC
  Filled 2023-02-15: qty 500

## 2023-02-15 MED ORDER — ONDANSETRON HCL 4 MG PO TABS: 4.0000 mg | ORAL_TABLET | ORAL | Status: DC | PRN

## 2023-02-15 MED ORDER — OXYCODONE-ACETAMINOPHEN 5-325 MG PO TABS: 1.0000 | ORAL_TABLET | ORAL | Status: DC | PRN

## 2023-02-15 MED ORDER — SENNOSIDES-DOCUSATE SODIUM 8.6-50 MG PO TABS
2.0000 | ORAL_TABLET | ORAL | Status: DC
  Administered 2023-02-16: 2 via ORAL
  Filled 2023-02-15: qty 2

## 2023-02-15 MED ORDER — MISOPROSTOL 200 MCG PO TABS
ORAL_TABLET | ORAL | Status: AC
  Filled 2023-02-15: qty 4

## 2023-02-15 MED ORDER — OXYTOCIN-SODIUM CHLORIDE 30-0.9 UT/500ML-% IV SOLN: 2.5000 [IU]/h | INTRAVENOUS | Status: DC

## 2023-02-15 MED ORDER — OXYCODONE-ACETAMINOPHEN 5-325 MG PO TABS: 2.0000 | ORAL_TABLET | ORAL | Status: DC | PRN

## 2023-02-15 MED ORDER — IBUPROFEN 600 MG PO TABS
600.0000 mg | ORAL_TABLET | Freq: Four times a day (QID) | ORAL | Status: DC
  Administered 2023-02-15 – 2023-02-16 (×5): 600 mg via ORAL
  Filled 2023-02-15 (×5): qty 1

## 2023-02-15 MED ORDER — PRENATAL MULTIVITAMIN CH
1.0000 | ORAL_TABLET | Freq: Every day | ORAL | Status: DC
  Administered 2023-02-15 – 2023-02-16 (×2): 1 via ORAL
  Filled 2023-02-15 (×2): qty 1

## 2023-02-15 MED ORDER — FENTANYL CITRATE (PF) 100 MCG/2ML IJ SOLN
50.0000 ug | INTRAMUSCULAR | Status: DC | PRN
  Administered 2023-02-15 (×2): 50 ug via INTRAVENOUS
  Filled 2023-02-15 (×2): qty 2

## 2023-02-15 MED ORDER — TERBUTALINE SULFATE 1 MG/ML IJ SOLN: 0.2500 mg | Freq: Once | INTRAMUSCULAR | Status: DC | PRN

## 2023-02-15 MED ORDER — COCONUT OIL OIL
1.0000 | TOPICAL_OIL | Status: DC | PRN
  Filled 2023-02-15: qty 7.5

## 2023-02-15 MED ORDER — ACETAMINOPHEN 325 MG PO TABS
650.0000 mg | ORAL_TABLET | ORAL | Status: DC | PRN
  Administered 2023-02-15 – 2023-02-16 (×3): 650 mg via ORAL
  Filled 2023-02-15 (×3): qty 2

## 2023-02-15 MED ORDER — LIDOCAINE HCL (PF) 1 % IJ SOLN
30.0000 mL | INTRAMUSCULAR | Status: AC | PRN
  Administered 2023-02-15: 30 mL via SUBCUTANEOUS
  Filled 2023-02-15: qty 30

## 2023-02-15 MED ORDER — SIMETHICONE 80 MG PO CHEW: 80.0000 mg | CHEWABLE_TABLET | ORAL | Status: DC | PRN

## 2023-02-15 MED ORDER — DOCUSATE SODIUM 100 MG PO CAPS
100.0000 mg | ORAL_CAPSULE | Freq: Two times a day (BID) | ORAL | Status: DC
  Administered 2023-02-16: 100 mg via ORAL
  Filled 2023-02-15: qty 1

## 2023-02-15 MED ORDER — MISOPROSTOL 25 MCG QUARTER TABLET: 25.0000 ug | ORAL_TABLET | Freq: Once | ORAL | Status: DC

## 2023-02-15 MED ORDER — OXYTOCIN BOLUS FROM INFUSION
333.0000 mL | Freq: Once | INTRAVENOUS | Status: AC
  Administered 2023-02-15: 333 mL via INTRAVENOUS

## 2023-02-15 MED ORDER — BENZOCAINE-MENTHOL 20-0.5 % EX AERO
1.0000 | INHALATION_SPRAY | CUTANEOUS | Status: DC | PRN
  Administered 2023-02-15: 1 via TOPICAL
  Filled 2023-02-15: qty 56

## 2023-02-15 MED ORDER — METHYLERGONOVINE MALEATE 0.2 MG/ML IJ SOLN: 0.2000 mg | INTRAMUSCULAR | Status: DC | PRN

## 2023-02-15 MED ORDER — LACTATED RINGERS IV SOLN: 500.0000 mL | INTRAVENOUS | Status: DC | PRN

## 2023-02-15 MED ORDER — METHYLERGONOVINE MALEATE 0.2 MG PO TABS: 0.2000 mg | ORAL_TABLET | ORAL | Status: DC | PRN

## 2023-02-15 MED ORDER — FERROUS SULFATE 325 (65 FE) MG PO TABS
325.0000 mg | ORAL_TABLET | Freq: Every day | ORAL | Status: DC
  Administered 2023-02-16: 325 mg via ORAL
  Filled 2023-02-15: qty 1

## 2023-02-15 MED ORDER — ONDANSETRON HCL 4 MG/2ML IJ SOLN: 4.0000 mg | INTRAMUSCULAR | Status: DC | PRN

## 2023-02-15 MED ORDER — WITCH HAZEL-GLYCERIN EX PADS
1.0000 | MEDICATED_PAD | CUTANEOUS | Status: DC | PRN
  Administered 2023-02-15: 1 via TOPICAL
  Filled 2023-02-15: qty 100

## 2023-02-15 MED ORDER — MISOPROSTOL 25 MCG QUARTER TABLET: 50.0000 ug | ORAL_TABLET | ORAL | Status: DC | PRN

## 2023-02-15 MED ORDER — MISOPROSTOL 25 MCG QUARTER TABLET: 50.0000 ug | ORAL_TABLET | Freq: Once | ORAL | Status: DC

## 2023-02-15 MED ORDER — SOD CITRATE-CITRIC ACID 500-334 MG/5ML PO SOLN: 30.0000 mL | ORAL | Status: DC | PRN

## 2023-02-15 MED ORDER — ACETAMINOPHEN 325 MG PO TABS
650.0000 mg | ORAL_TABLET | ORAL | Status: DC | PRN
  Administered 2023-02-15: 650 mg via ORAL
  Filled 2023-02-15: qty 2

## 2023-02-15 MED ORDER — LACTATED RINGERS IV SOLN: INTRAVENOUS | Status: DC

## 2023-02-15 MED ORDER — AMMONIA AROMATIC IN INHA
RESPIRATORY_TRACT | Status: AC
  Filled 2023-02-15: qty 10

## 2023-02-15 NOTE — Discharge Summary (Signed)
     Postpartum Discharge Summary  Date of Service updated***     Patient Name: Teresa [name] DOB: 23-Oct-2000 MRN: [id number]  Date of admission: 02/14/2023 Delivery date:02/15/2023  Delivering provider: Doreene Burke  Date of discharge:   Admitting diagnosis: Post-dates pregnancy [O48.0] Intrauterine pregnancy: [redacted]w[redacted]d     Secondary diagnosis:  Principal Problem:   Post-dates pregnancy  Additional problems: none    Discharge diagnosis: Term Pregnancy Delivered                                              Post partum procedures:{Postpartum procedures:23558} Augmentation: AROM Complications: None  Hospital course: Onset of Labor With Vaginal Delivery      22 y.o. yo G2P0010 at [redacted]w[redacted]d was admitted in Latent Labor on 02/14/2023. Labor course was uncomplicated.  Membrane Rupture Time/Date: 5:00 AM ,02/15/2023   Delivery Method:Vaginal, Spontaneous  Episiotomy: None  Lacerations:  2nd degree;Perineal  Patient had a postpartum course complicated by ***.  She is ambulating, tolerating a regular diet, passing flatus, and urinating well. Patient is discharged home in stable condition on 02/15/23.  Newborn Data: Birth date:02/15/2023  Birth time:7:45 AM  Gender:Female  Living status:Living  Apgars: ,  Weight:   Magnesium Sulfate received: No BMZ received: No Rhophylac:N/A MMR:N/A T-DaP:Given prenatally Flu: N/A Transfusion:No  Physical exam  Vitals:   02/15/23 0345 02/15/23 0710 02/15/23 0755 02/15/23 0810  BP: 120/75 139/83 122/71 117/65  Pulse: 74 98 (!) 114 (!) 126  Resp: 18 20    Temp: 98 F (36.7 C)     TempSrc: Oral     Weight:      Height:       General: {Exam; general:21111117} Lochia: {Desc; appropriate/inappropriate:30686::"appropriate"} Uterine Fundus: {Desc; firm/soft:30687} Incision: N/A DVT Evaluation: {Exam; dvt:2111122} Labs: Lab Results  Component Value Date   WBC 8.9 02/15/2023   HGB 13.1 02/15/2023   HCT 38.6 02/15/2023   MCV 85.8 02/15/2023    PLT 190 02/15/2023      Latest Ref Rng & Units 12/20/2021    4:42 PM  CMP  Glucose 70 - 99 mg/dL 89   BUN 6 - 20 mg/dL 10   Creatinine 4.09 - 1.00 mg/dL 8.11   Sodium 914 - 782 mmol/L 137   Potassium 3.5 - 5.1 mmol/L 3.5   Chloride 98 - 111 mmol/L 104   CO2 22 - 32 mmol/L 26   Calcium 8.9 - 10.3 mg/dL 8.8    Edinburgh Score:     No data to display            After visit meds:  Allergies as of 02/15/2023   No Known Allergies   Med Rec must be completed prior to using this [facility]***        Discharge home in stable condition Infant Feeding: Breast Infant Disposition:{CHL IP OB HOME WITH NFAOZH:08657} Discharge instruction: per After Visit Summary and Postpartum booklet. Activity: Advance as tolerated. Pelvic rest for 6 weeks.  Diet: {OB diet:21111121} Anticipated Birth Control: {Birth Control:23956} Postpartum Appointment:{Outpatient follow up:23559} Additional Postpartum F/U: {PP Procedure:23957} Future Appointments: Future Appointments  Date Time Provider Department Center  02/17/2023  9:40 AM AC-MH PROVIDER AC-MAT None   Follow up Visit:      02/15/2023 Doreene Burke, CNM

## 2023-02-15 NOTE — H&P (Signed)
History and Physical   HPI  Teresa [name] is a 22 y.o. G2P0010 at [redacted]w[redacted]d Estimated Date of Delivery: 02/13/23 who is being admitted for labor management   OB History  OB History  Gravida Para Term Preterm AB Living  2 0 0 0 1 0  SAB IAB Ectopic Multiple Live Births  0 0 0 0 0    # Outcome Date GA Lbr Len/2nd Weight Sex Delivery Anes PTL Lv  2 Current           1 AB 12/20/21 [redacted]w[redacted]d    SAB       PROBLEM LIST  Pregnancy complications or risks: Patient Active Problem List   Diagnosis Date Noted   Post-dates pregnancy 02/15/2023   Indication for care in labor or delivery 02/13/2023   Late prenatal care 15 4/7 07/30/2022   Supervision of other normal pregnancy, antepartum 07/29/2022   History of non-nicotine vaping 07/29/2022    Prenatal labs and studies: ABO, Rh: --/--/PENDING (07/08 0211) Antibody: PENDING (07/08 0211) Rubella: 1.04 (12/20 1603) RPR: Non Reactive (04/10 1629)  HBsAg: Negative (12/20 1603)  HIV: Non Reactive (12/20 1603)  ZOX:WRUEAVWU/-- (06/13 1027)   Past Medical History:  Diagnosis Date   Abnormal blood sugar    Physician in [place] said her "sugar was high" x1; no issues since   History of constipation    age 8 - 15 years   History of constipation 07/29/2022     Past Surgical History:  Procedure Laterality Date   Denies surgical history     NO PAST SURGERIES       Medications    Current Discharge Medication List     CONTINUE these medications which have NOT CHANGED   Details  Prenatal Vit-Fe Fumarate-FA (PRENATAL VITAMINS) 28-0.8 MG TABS Take 1 tablet by mouth daily. Qty: 100 tablet, Refills: 0   Associated Diagnoses: Pregnancy examination or test, pregnancy unconfirmed         Allergies  Patient has no known allergies.  Review of Systems  Constitutional: negative Eyes: negative Ears, nose, mouth, throat, and face: negative Respiratory: negative Cardiovascular: negative Gastrointestinal:  negative Genitourinary:negative Integument/breast: negative Hematologic/lymphatic: negative Musculoskeletal:negative Neurological: negative Behavioral/Psych: negative Endocrine: negative Allergic/Immunologic: negative  Physical Exam  BP 116/68   Pulse 73   Temp 97.9 F (36.6 C) (Oral)   Resp 18   Ht 5\' 7"  (1.702 m)   Wt 94.6 kg   LMP 04/11/2022 (Approximate)   BMI 32.66 kg/m   Lungs:  CTA B Cardio: RRR without M/R/G Abd: Soft, gravid, NT Presentation: cephalic EXT: No C/C/ 1+ Edema DTRs: 2+ B CERVIX: Dilation: 5.5 Effacement (%): 80 Cervical Position: Middle Station: -2 Presentation: Vertex Exam by:: RRL  See Prenatal records for more detailed PE.     FHR:  Baseline: 130 bpm, Variability: Good {> 6 bpm), Accelerations: Reactive, and Decelerations: Absent  Toco: Uterine Contractions: Frequency: Every 2-3 minutes  Test Results  Results for orders placed or performed during the hospital encounter of 02/14/23 (from the past 24 hour(s))  CBC     Status: None   Collection Time: 02/15/23  2:11 AM  Result Value Ref Range   WBC 8.9 4.0 - 10.5 K/uL   RBC 4.50 3.87 - 5.11 MIL/uL   Hemoglobin 13.1 12.0 - 15.0 g/dL   HCT 98.1 19.1 - 47.8 %   MCV 85.8 80.0 - 100.0 fL   MCH 29.1 26.0 - 34.0 pg   MCHC 33.9 30.0 - 36.0 g/dL  RDW 12.5 11.5 - 15.5 %   Platelets 190 150 - 400 K/uL   nRBC 0.0 0.0 - 0.2 %  Type and screen     Status: None (Preliminary result)   Collection Time: 02/15/23  2:11 AM  Result Value Ref Range   ABO/RH(D) PENDING    Antibody Screen PENDING    Sample Expiration      02/18/2023,2359 Performed at [facility], [address], [place], [place] [address]    Group B Strep negative  Assessment   G2P0010 at [redacted]w[redacted]d Estimated Date of Delivery: 02/13/23  The fetus is reassuring.   Patient Active Problem List   Diagnosis Date Noted   Post-dates pregnancy 02/15/2023   Indication for care in labor or delivery 02/13/2023   Late prenatal  care 15 4/7 07/30/2022   Supervision of other normal pregnancy, antepartum 07/29/2022   History of non-nicotine vaping 07/29/2022    Plan  1. Admit to L&D :   2. EFM:-- Category 1 3. IV pain medication or Epidural if desired.   4. Admission labs  5. Dr. Valentino Saxon notified of admission 6. Anticipate NSVD  Doreene Burke, CNM  02/15/2023 2:32 AM

## 2023-02-15 NOTE — Progress Notes (Signed)
LABOR NOTE   Teresa [name] 22 y.o.GP@ at [redacted]w[redacted]d  SUBJECTIVE:  Very uncomfortable with contractions Analgesia: Labor support without medications  OBJECTIVE:  BP 120/75 (BP Location: Left Arm)   Pulse 74   Temp 98 F (36.7 C) (Oral)   Resp 18   Ht 5\' 7"  (1.702 m)   Wt 94.6 kg   LMP 04/11/2022 (Approximate)   BMI 32.66 kg/m  No intake/output data recorded.  She has shown cervical change. CERVIX: 8-9 cm:  100%:   -1:   mid position:   soft SVE:   Dilation: 8.5 Effacement (%): 100 Station: -1 Exam by:: Janee Morn, CNM CONTRACTIONS: regular, every 2-3 minutes FHR: Fetal heart tracing reviewed. Baseline: 135 bpm, Variability: Good {> 6 bpm), Accelerations: Reactive, and Decelerations: variable  Category II    Labs: Lab Results  Component Value Date   WBC 8.9 02/15/2023   HGB 13.1 02/15/2023   HCT 38.6 02/15/2023   MCV 85.8 02/15/2023   PLT 190 02/15/2023    ASSESSMENT: 1) Labor curve reviewed.       Progress: Active phase labor.     Membranes: ruptured, meconium moderate       Principal Problem:   Post-dates pregnancy   PLAN: Discussed AROM to assist pt to complete , she and her partner are in agreement. AROM moderate mec noted. Reviewed with pt and her partner.  Continue present management  Interpretor used to translate our conversation.   Doreene Burke, CNM . 02/15/2023 5:06 AM

## 2023-02-15 NOTE — Lactation Note (Signed)
This note was copied from a baby's chart. Lactation Consultation Note  Patient Name: Teresa [name] FAOZH'Y Date: 02/15/2023 Age:20 hours Reason for consult: Initial assessment;Primapara;Term Spanish interpreter per video ipad utilized   Maternal Data Does the patient have breastfeeding experience [name] to this delivery?: No  Feeding Mother's Current Feeding Choice: Breast Milk LC rounds with ipad spanish interpreter, mom encouraged to offer breast to baby at least 8x/24 hr or every 2-3 hrs, offer both breasts at a feeding, she states she heard swallows at last feeding, mom had no questions at present, [facility] name and no written on white board to call for questions, concerns or assist.   LATCH Score Latch:  (I did not observe a feeding at this visit)                  Lactation Tools Discussed/Used    Interventions Interventions: Breast feeding basics reviewed;Education  Discharge Pump: Advised to call insurance company [facility] Program: No  Consult Status Consult Status: PRN    Dyann Kief 02/15/2023, 3:49 PM

## 2023-02-15 NOTE — Progress Notes (Signed)
LABOR NOTE   Teresa [name] 22 y.o.GP@ at [redacted]w[redacted]d  SUBJECTIVE:  pushing Analgesia: Labor support without medications  OBJECTIVE:  BP 139/83   Pulse 98   Temp 98 F (36.7 C) (Oral)   Resp 20   Ht 5\' 7"  (1.702 m)   Wt 94.6 kg   LMP 04/11/2022 (Approximate)   BMI 32.66 kg/m  No intake/output data recorded.  She has shown cervical change. CERVIX: 10 cm:  100%:   +1:   mid position:    SVE:   Dilation: Lip/rim Effacement (%): 100 Station: -1, 0 Exam by:: Wakeelah Solan, CONTRACTIONS: regular, every 2 minutes FHR: Fetal heart tracing reviewed. Baseline: 120 bpm, Variability: Good {> 6 bpm), Accelerations: Non-reactive but appropriate for gestational age, and Decelerations: variable   Category II    Labs: Lab Results  Component Value Date   WBC 8.9 02/15/2023   HGB 13.1 02/15/2023   HCT 38.6 02/15/2023   MCV 85.8 02/15/2023   PLT 190 02/15/2023    ASSESSMENT: 1) Labor curve reviewed.       Progress: Active phase labor.     Membranes: ruptured            Principal Problem:   Post-dates pregnancy   PLAN: Pt has been pushing for 2 hours. She had difficulty initially and was not pushing well. She has been pushing well for the last 1 . 5 hrs. There has been slow descent of fetal head. Suspect OP presentation.  She has rotated pushing positions. Discussed progress with Dr. Logan Bores, strip reviewed. Continue pushing.     Doreene Burke, CNM  02/15/2023 7:15 AM

## 2023-02-15 NOTE — Lactation Note (Signed)
This note was copied from a baby's chart. Lactation Consultation Note  Patient Name: Teresa [name] [id number] Date: 02/15/2023 Age:20 hours Reason for consult: Follow-up assessment   Maternal Data Has patient been taught Hand Expression?: Yes Does the patient have breastfeeding experience prior to this delivery?: No  Feeding Mother's Current Feeding Choice: Breast Milk Mom had not fed baby on right breast since delivery, diaper changed and mom turned to right side with baby positioned beside her, baby latched easily and began nursing well with some swallows noted, mom encouraged to offer left breast when baby comes off right LATCH Score Latch: Grasps breast easily, tongue down, lips flanged, rhythmical sucking.  Audible Swallowing: A few with stimulation  Type of Nipple: Everted at rest and after stimulation  Comfort (Breast/Nipple): Soft / non-tender  Hold (Positioning): Assistance needed to correctly position infant at breast and maintain latch.  LATCH Score: 8   Lactation Tools Discussed/Used    Interventions Interventions: Assisted with latch;Skin to skin;Hand express;Adjust position;Coconut oil;Education  Discharge Pump: Advised to call insurance company [facility] Program: No  Consult Status Consult Status: Follow-up Date: 02/16/23 Follow-up type: In-patient    Dyann Kief 02/15/2023, 6:21 PM

## 2023-02-16 LAB — CBC
HCT: 26.5 % — ABNORMAL LOW (ref 36.0–46.0)
Hemoglobin: 8.8 g/dL — ABNORMAL LOW (ref 12.0–15.0)
MCH: 29.2 pg (ref 26.0–34.0)
MCHC: 33.2 g/dL (ref 30.0–36.0)
MCV: 88 fL (ref 80.0–100.0)
Platelets: 182 10*3/uL (ref 150–400)
RBC: 3.01 MIL/uL — ABNORMAL LOW (ref 3.87–5.11)
RDW: 13.1 % (ref 11.5–15.5)
WBC: 12.9 10*3/uL — ABNORMAL HIGH (ref 4.0–10.5)
nRBC: 0 % (ref 0.0–0.2)

## 2023-02-16 MED ORDER — IBUPROFEN 600 MG PO TABS: 600.0000 mg | ORAL_TABLET | Freq: Four times a day (QID) | ORAL | 0 refills | Status: DC

## 2023-02-16 MED ORDER — FERROUS SULFATE 325 (65 FE) MG PO TABS: 325.0000 mg | ORAL_TABLET | Freq: Every day | ORAL | 1 refills | Status: DC

## 2023-02-16 MED ORDER — WITCH HAZEL-GLYCERIN EX PADS: 1.0000 | MEDICATED_PAD | CUTANEOUS | 12 refills | Status: DC | PRN

## 2023-02-16 MED ORDER — DIBUCAINE (PERIANAL) 1 % EX OINT: 1.0000 | TOPICAL_OINTMENT | CUTANEOUS | Status: DC | PRN

## 2023-02-16 MED ORDER — ACETAMINOPHEN 325 MG PO TABS: 1000.0000 mg | ORAL_TABLET | Freq: Three times a day (TID) | ORAL | Status: DC | PRN

## 2023-02-16 MED ORDER — COCONUT OIL OIL: 1.0000 | TOPICAL_OIL | 0 refills | Status: DC | PRN

## 2023-02-16 MED ORDER — BENZOCAINE-MENTHOL 20-0.5 % EX AERO: 1.0000 | INHALATION_SPRAY | CUTANEOUS | Status: DC | PRN

## 2023-02-16 MED ORDER — DOCUSATE SODIUM 100 MG PO CAPS: 100.0000 mg | ORAL_CAPSULE | Freq: Two times a day (BID) | ORAL | 1 refills | Status: DC

## 2023-02-16 NOTE — Progress Notes (Signed)
Patient discharged home with family. Discharge instructions given with the use of translator, when to follow up, and prescriptions reviewed with patient. Patient verbalized understanding. Patient will be escorted out by auxiliary.

## 2023-02-16 NOTE — Lactation Note (Signed)
This note was copied from a baby's chart. Lactation Consultation Note  Patient Name: Boy Zasha Belleau Today's Date: 02/16/2023 Age:20 hours Reason for consult: Initial assessment;1st time breastfeeding;Term  Ipad Interpreter used for all communications with patient.   Lactation Rounds: LC to the room for a visit. Mother and baby are resting in the room with FOB.  Mother states feeds are going well. LC reviewed and encouraged feeding on demand and with cues. If baby is not cueing we encourage hand expression and to wake baby. Reviewed diaper counts for days of life and when to call Peds with questions. Reviewed "understanding Postpartum and Newborn care " booklet at bedside. Reviewed outpatient Lactation number and resources.  Reviewed manual pump at bedside, given for home use. Mother does not have WIC but has Medicaid.  Parents stated understanding with all teaching and have no further questions.   Maternal Data Does the patient have breastfeeding experience prior to this delivery?: No  Feeding Mother's Current Feeding Choice: Breast Milk  Interventions Interventions: Breast feeding basics reviewed;Hand pump;Education  Discharge Discharge Education: Engorgement and breast care;Warning signs for feeding baby Pump: Manual (gave and reviewed manual pump) WIC Program: No  Consult Status Consult Status: Complete   Aimi Essner D Sherry Blackard 02/16/2023, 10:22 AM

## 2023-02-17 ENCOUNTER — Ambulatory Visit: Payer: Medicaid Other

## 2023-02-17 NOTE — Telephone Encounter (Signed)
Call to client with Teresa [name] to notify her of IOL appt. Left message to call with number to call provided. Jossie Ng, RN

## 2023-02-17 NOTE — Telephone Encounter (Signed)
Client returned call and per Denzil Hughes, client delivered 02/15/2023. Jossie Ng, RN

## 2023-02-25 ENCOUNTER — Other Ambulatory Visit: Payer: Medicaid Other

## 2023-03-31 ENCOUNTER — Ambulatory Visit: Payer: Medicaid Other | Admitting: Family Medicine

## 2023-03-31 ENCOUNTER — Encounter: Payer: Self-pay | Admitting: Family Medicine

## 2023-03-31 DIAGNOSIS — Z113 Encounter for screening for infections with a predominantly sexual mode of transmission: Secondary | ICD-10-CM

## 2023-03-31 DIAGNOSIS — Z3009 Encounter for other general counseling and advice on contraception: Secondary | ICD-10-CM

## 2023-03-31 DIAGNOSIS — Z30017 Encounter for initial prescription of implantable subdermal contraceptive: Secondary | ICD-10-CM | POA: Diagnosis not present

## 2023-03-31 LAB — HEMOGLOBIN, FINGERSTICK: Hemoglobin: 10.8 g/dL — ABNORMAL LOW (ref 11.1–15.9)

## 2023-03-31 LAB — WET PREP FOR TRICH, YEAST, CLUE
Trichomonas Exam: NEGATIVE
Yeast Exam: NEGATIVE

## 2023-03-31 MED ORDER — IRON (FERROUS SULFATE) 325 (65 FE) MG PO TABS
325.0000 mg | ORAL_TABLET | Freq: Two times a day (BID) | ORAL | Status: DC
Start: 2023-03-31 — End: 2024-06-16

## 2023-03-31 MED ORDER — ETONOGESTREL 68 MG ~~LOC~~ IMPL
68.0000 mg | DRUG_IMPLANT | Freq: Once | SUBCUTANEOUS | Status: AC
Start: 2023-03-31 — End: 2023-03-31
  Administered 2023-03-31: 68 mg via SUBCUTANEOUS

## 2023-03-31 NOTE — Progress Notes (Signed)
In house lab results reviewed during visit.  The patient was dispensed Iron today. I provided counseling today regarding the medication. We discussed the medication, the side effects and when to call clinic. Patient given the opportunity to ask questions. Questions answered. BTHIELE RN    [employer] RN

## 2023-03-31 NOTE — Progress Notes (Signed)
[facility] Department  Postpartum Exam  Teresa [name] is a 22 y.o. G70P1011 female who presents for a postpartum visit. She is 6 weeks postpartum following a normal spontaneous vaginal delivery.  I have fully reviewed the prenatal and intrapartum course. The delivery was at [redacted]w[redacted]d gestational weeks.  Anesthesia: local. Postpartum course has been ok. Baby is doing well. Baby is feeding by both breast and bottle - Similac Advance. Bleeding no bleeding. Bowel function is normal. Bladder function is normal. Patient is sexually active. Contraception method is condoms. Postpartum depression screening: negative.   The pregnancy intention screening data noted above was reviewed. Potential methods of contraception were discussed. The patient elected to proceed with Hormonal Implant.    Health Maintenance Due  Topic Date Due   HPV VACCINES (1 - 3-dose series) Never done   COVID-19 Vaccine (1 - 2023-24 season) Never done   INFLUENZA VACCINE  03/11/2023    The following portions of the patient's history were reviewed and updated as appropriate: allergies, current medications, past family history, past medical history, past social history, past surgical history, and problem list.  Review of Systems Pertinent items are noted in HPI.  Objective:  BP (!) 89/60 Comment: left arm, normal size cuff  Pulse (!) 59   Temp (!) 97.4 F (36.3 C)   Wt 187 lb 9.6 oz (85.1 kg)   Breastfeeding Yes Comment: breat and bottle feeding  BMI 29.38 kg/m    General:  alert   Breasts:  not indicated  Lungs: Not indicated  Heart:  Not indicated  Abdomen: soft, non-tender; bowel sounds normal; no masses,  no organomegaly   Wound none  GU exam:  normal       Assessment:    1. Postpartum care following vaginal delivery  - Hemoglobin, venipuncture  2. Family planning -desires Nexplanon  3. Encounter for initial prescription of Nexplanon Nexplanon Insertion Procedure Patient identified,  informed consent performed, consent signed.   Patient does understand that irregular bleeding is a very common side effect of this medication. She was advised to have backup contraception after placement. Patient was determined to meet WHO criteria for not being pregnant. Appropriate time out taken.  The insertion site was identified 8-10 cm (3-4 inches) from the medial epicondyle of the humerus and 3-5 cm (1.25-2 inches) posterior to (below) the sulcus (groove) between the biceps and triceps muscles of the patient's right arm and marked.  Patient was prepped with alcohol swab and then injected with 3 ml of 1% lidocaine.  Arm was prepped with chlorhexidene, Nexplanon removed from packaging,  Device confirmed in needle, then inserted full length of needle and withdrawn per handbook instructions. Nexplanon was able to palpated in the patient's arm; patient palpated the insert herself. There was minimal blood loss.  Patient insertion site covered with guaze and a pressure bandage to reduce any bruising.  The patient tolerated the procedure well and was given post procedure instructions.    Nexplanon:   Counseled patient to take OTC analgesic starting as soon as lidocaine starts to wear off and take regularly for at least 48 hr to decrease discomfort.  Specifically to take with food or milk to decrease stomach upset and for IB 600 mg (3 tablets) every 6 hrs; IB 800 mg (4 tablets) every 8 hrs; or Aleve 2 tablets every 12 hrs.   - etonogestrel (NEXPLANON) implant 68 mg  4. Screening for venereal disease  - Chlamydia/Gonorrhea [facility] - WET PREP FOR TRICH,  YEAST, CLUE      Plan:   Essential components of care per ACOG recommendations:  1.  Mood and well being: Patient with negative depression screening today. Reviewed local resources for support.  - Patient tobacco use? No.   - hx of drug use? No.    2. Infant care and feeding:  -Patient currently breastmilk feeding? Yes. Reviewed importance  of draining breast regularly to support lactation.  -Social determinants of health (SDOH) reviewed in EPIC. No concerns  3. Sexuality, contraception and birth spacing - Patient does not want a pregnancy in the next year.  Desired family size is 1 children.  - Reviewed reproductive life planning. Reviewed options based on patient desire and reproductive life plan. Patient is interested in Hormonal Implant. This was provided to the patient today.   Risks, benefits, and typical effectiveness rates were reviewed.  Questions were answered.  Written information was also given to the patient to review.    The patient will follow up in  1 years for surveillance.  The patient was told to call with any further questions, or with any concerns about this method of contraception.  Emphasized use of condoms 100% of the time for STI prevention.  Patient was not offered ECP based on using condoms with sex.   - Discussed birth spacing of 18 months  4. Sleep and fatigue -Encouraged family/partner/community support of 4 hrs of uninterrupted sleep to help with mood and fatigue  5. Physical Recovery  - Discussed patients delivery and complications. She describes her labor as good. - Patient had a Vaginal, no problems at delivery. Patient had a 2nd degree laceration. Perineal healing reviewed. Patient expressed understanding - Patient has urinary incontinence? No. - Patient is safe to resume physical and sexual activity  6.  Health Maintenance - HM due items addressed Yes - Last pap smear No results found for: "DIAGPAP" Pap smear not done at today's visit.  -Breast Cancer screening indicated? No.   7. Chronic Disease/Pregnancy Condition follow up: None  Due to language barrier, interpreter Camile [phone]) was present for this visit.  Lenice Llamas, FNP [facility] Department

## 2024-01-06 ENCOUNTER — Ambulatory Visit: Payer: Self-pay | Admitting: Physician Assistant

## 2024-01-06 ENCOUNTER — Encounter: Payer: Self-pay | Admitting: Physician Assistant

## 2024-01-06 DIAGNOSIS — Z113 Encounter for screening for infections with a predominantly sexual mode of transmission: Secondary | ICD-10-CM

## 2024-01-06 LAB — WET PREP FOR TRICH, YEAST, CLUE
Clue Cell Exam: POSITIVE — AB
Trichomonas Exam: NEGATIVE
Yeast Exam: NEGATIVE

## 2024-01-06 LAB — HM HIV SCREENING LAB: HM HIV Screening: NEGATIVE

## 2024-01-06 LAB — HEPATITIS B SURFACE ANTIGEN: Hepatitis B Surface Ag: NONREACTIVE

## 2024-01-06 MED ORDER — METRONIDAZOLE 500 MG PO TABS
500.0000 mg | ORAL_TABLET | Freq: Two times a day (BID) | ORAL | Status: AC
Start: 2024-01-06 — End: 2024-01-13

## 2024-01-06 NOTE — Progress Notes (Signed)
 Pt is here for std screening, wet prep results reviewed with pt, treatment required per standing order. The patient was dispensed metronidazole #14  today. I provided counseling today regarding the medication. We discussed the medication, the side effects and when to call clinic. Patient given the opportunity to ask questions. Questions answered.  Caren Channel, RN

## 2024-01-06 NOTE — Progress Notes (Signed)
 [facility] Department STI clinic [address] [place] [address] Main phone: [phone]  STI screening visit  Subjective:  Teresa [name] is a 23 y.o. female being seen today for an STI screening visit. The patient reports they do have symptoms.  Patient reports that they do not desire a pregnancy in the next year.   They reported they are not interested in discussing contraception today.    No LMP recorded.  Patient has the following medical conditions:  Patient Active Problem List   Diagnosis Date Noted   History of non-nicotine vaping 07/29/2022   Chief Complaint  Patient presents with   SEXUALLY TRANSMITTED DISEASE    HPI Patient reports new itchy vaginal discharge.  Does the patient using douching products? No  See flowsheet for further details and programmatic requirements Hyperlink available at the top of the signed note in blue.  Flow sheet content below:  Pregnancy Intention Screening Does the patient want to become pregnant in the next year?: No Does the patient's partner want to become pregnant in the next year?: No Would the patient like to discuss contraceptive options today?: No All Patients Anyone smoke around pt and/or pt's children?: No Anyone smoke inside pt's house?: No Anyone smoke inside car?: No Anyone smoke inside the workplace?: No Reason For STD Screen STD Screening: Is asymptomatic, Has symptoms Have you ever had an STD?: No History of Antibiotic use in the past 2 weeks?: No STD Symptoms Genital Itching: Yes Genital Itch s/s: vag itch for 1 week Lower abdominal pain: No Discharge: Yes Discharge s/s: vag disharch for 8 days, white, itchy, has not tried any remedies Dysuria: No Genital ulcer / lesion: No Rash: No Vaginal irritation: No Oral / Other skin ulcer: No Pain with sex: No Sore Throat: No Visual Changes: No Vaginal Bleeding: No Other (Describe in Comments): No Risk Factors for  Hep B Household, sexual, or needle sharing contact of a person infected with Hep B: No Sexual contact with a person who uses drugs not as prescribed?: Yes Currently or Ever used drugs not as prescribed: No HIV Positive: No PRep Patient: No Men who have sex with men: No Have Hepatitis C: No History of Incarceration: No History of Homeslessness?: No Anal sex following anal drug use?: No Risk Factors for Hep C Currently using drugs not as prescribed: No Sexual partner(s) currently using drugs as not prescribed: No History of drug use: No HIV Positive: No People with a history of incarceration: No People born between the years of 70 and 80: No Hepatitis Counseling Hep B Counseling: Counseled patient about increased risk of Hep B and recommendation for testing, Patient accepts testing for Hep B today Advise Advised client to quit or stay quit. : Yes Assess # of cigarettes per day: 0 Abuse History Has patient ever been abused physically?: No Has patient ever been abused sexually?: No Does patient feel they have a problem with Anxiety?: No Does patient feel they have a problem with Depression?: No Referral to Behavioral Health: No Counseling Patient counseled to use condoms with all sex: Condoms declined RTC in 2-3 weeks for test results: Yes Clinic will call if test results abnormal before test result appt.: Yes Test results given to patient Patient counseled to use condoms with all sex: Condoms declined   Screening for MPX risk: Does the patient have an unexplained rash? No Is the patient MSM? No Does the patient endorse multiple sex partners or anonymous sex partners? No  Did the patient have close or sexual contact with a person diagnosed with MPX? No Has the patient traveled outside the [place]  where MPX is endemic? No Is there a high clinical suspicion for MPX-- evidenced by one of the following No  -Unlikely to be chickenpox  -Lymphadenopathy  -Rash that present in same  phase of evolution on any given body part  Screenings: Last HIV test per patient/review of record was  Lab Results  Component Value Date   HMHIVSCREEN Negative - Validated 01/14/2021    Lab Results  Component Value Date   HIV Non Reactive 07/29/2022     Last HEPC test per patient/review of record was  Lab Results  Component Value Date   HMHEPCSCREEN Negative-Validated 01/14/2021   No components found for: "HEPC"   Last HEPB test per patient/review of record was No components found for: "HMHEPBSCREEN"   Patient reports last pap was:   No results found for: "SPECADGYN" No Cervical Cancer Screening results to display.  Immunization history:  Immunization History  Administered Date(s) Administered   Hepatitis A, Ped/Adol-2 Dose 03/30/2006, 05/16/2007   Hepatitis B, PED/ADOLESCENT 05/21/2003, 03/05/2003, 07/16/2003   MMR 04/07/2004, 05/16/2007   Pneumococcal Conjugate PCV 7 03/05/2003, 04/23/2003, 07/16/2003, 04/07/2004   Tdap 11/18/2022   Varicella 01/29/2004, 05/16/2007    The following portions of the patient's history were reviewed and updated as appropriate: allergies, current medications, past medical history, past social history, past surgical history and problem list.  Objective:   There were no vitals filed for this visit.  Physical Exam Vitals and nursing note reviewed. Exam conducted with a chaperone present Edgar Goods T, interpreter).  Constitutional:      Appearance: Normal appearance.  HENT:     Head: Normocephalic and atraumatic.     Mouth/Throat:     Mouth: Mucous membranes are moist.     Pharynx: Oropharynx is clear. No oropharyngeal exudate or posterior oropharyngeal erythema.  Pulmonary:     Effort: Pulmonary effort is normal.  Abdominal:     General: Abdomen is flat.     Palpations: There is no mass.     Tenderness: There is no abdominal tenderness. There is no rebound.  Genitourinary:    General: Normal vulva.     Exam position: Lithotomy  position.     Pubic Area: No rash or pubic lice.      Labia:        Right: No rash or lesion.        Left: No rash or lesion.      Vagina: Vaginal discharge present. No erythema, bleeding or lesions.     Cervix: No cervical motion tenderness, discharge, friability, lesion or erythema.     Uterus: Normal.      Adnexa: Right adnexa normal and left adnexa normal.     Rectum: Normal.     Comments: pH = 6.0 Thick white vag discharge without odor Lymphadenopathy:     Head:     Right side of head: No preauricular or posterior auricular adenopathy.     Left side of head: No preauricular or posterior auricular adenopathy.     Cervical: No cervical adenopathy.     Upper Body:     Right upper body: No supraclavicular, axillary or epitrochlear adenopathy.     Left upper body: No supraclavicular, axillary or epitrochlear adenopathy.     Lower Body: No right inguinal adenopathy. No left inguinal adenopathy.  Skin:    General: Skin is warm and dry.  Findings: No rash.  Neurological:     Mental Status: She is alert and oriented to person, place, and time.      Assessment and Plan:  Tazia Illescas is a 23 y.o. female presenting to the [facility] Department for STI screening  1. Routine screening for STI (sexually transmitted infection) (Primary) Exam and wet prep c/w bacterial vaginosis. Treat per standing orders. - WET PREP FOR TRICH, YEAST, CLUE - HBV Antigen/Antibody [facility] - HIV [place] [facility] - Chlamydia/Gonorrhea [place] [facility] - Syphilis Serology, [facility]   Patient accepted the following screenings: vaginal CT/GC swabs, vaginal wet prep, HIV, RPR, and Hep B Patient meets criteria for HepB screening? Yes. Ordered? yes Patient meets criteria for HepC screening? No. Ordered? not applicable  Treat wet prep per standing order Discussed time line for [facility] results and that patient will be called with positive results and encouraged patient to call if she  had not heard in 2 weeks.  Counseled to return or seek care for continued or worsening symptoms Recommended repeat testing in 3 months with positive results. Recommended condom use with all sex for STI prevention.   Patient is currently using *Nexplanon  to prevent pregnancy.   Due to language barrier, a Spanish interpreter Edgar Goods T.) was present in person during the history-taking, subsequent discussion, and physical exam with this patient.    Return in about 6 months (around 07/08/2024) for STI screening.  No future appointments.  Mary Secord, PA-C

## 2024-06-16 ENCOUNTER — Other Ambulatory Visit: Payer: Self-pay

## 2024-06-16 ENCOUNTER — Encounter: Payer: Self-pay | Admitting: Emergency Medicine

## 2024-06-16 ENCOUNTER — Emergency Department
Admission: EM | Admit: 2024-06-16 | Discharge: 2024-06-16 | Disposition: A | Discharge status: Home / Self Care | Attending: Emergency Medicine | Admitting: Emergency Medicine

## 2024-06-16 ENCOUNTER — Emergency Department

## 2024-06-16 DIAGNOSIS — Y92410 Unspecified street and highway as the place of occurrence of the external cause: Secondary | ICD-10-CM | POA: Diagnosis not present

## 2024-06-16 DIAGNOSIS — S161XXA Strain of muscle, fascia and tendon at neck level, initial encounter: Secondary | ICD-10-CM | POA: Diagnosis not present

## 2024-06-16 DIAGNOSIS — M542 Cervicalgia: Secondary | ICD-10-CM | POA: Diagnosis present

## 2024-06-16 MED ORDER — NAPROXEN 500 MG PO TABS: 500.0000 mg | ORAL_TABLET | Freq: Two times a day (BID) | ORAL | 0 refills | Status: AC

## 2024-06-16 MED ORDER — METHOCARBAMOL 500 MG PO TABS: 500.0000 mg | ORAL_TABLET | Freq: Three times a day (TID) | ORAL | 0 refills | Status: AC | PRN
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
